# Patient Record
Sex: Female | Born: 1960
Health system: Southern US, Community
[De-identification: ages and names within clinical notes are randomized; demographics above are authoritative.]

## PROBLEM LIST (undated history)

## (undated) DIAGNOSIS — M1990 Unspecified osteoarthritis, unspecified site: Secondary | ICD-10-CM

## (undated) DIAGNOSIS — I10 Essential (primary) hypertension: Secondary | ICD-10-CM

## (undated) HISTORY — PX: UTERINE FIBROID EMBOLIZATION: SHX825

---

## 2012-11-02 ENCOUNTER — Telehealth: Payer: Self-pay | Admitting: Nurse Practitioner

## 2012-11-02 NOTE — Telephone Encounter (Signed)
NOT SEEN GREATER 3 YEARS. EXPLAINED TO PT WILL TAKE NEW PTS STARTING June 1ST. Pt c/o small rash on back. No blisters. Will go to urgent care per pt.

## 2012-11-23 ENCOUNTER — Ambulatory Visit: Payer: Self-pay | Admitting: Family Medicine

## 2013-11-28 ENCOUNTER — Encounter: Payer: Self-pay | Admitting: Physician Assistant

## 2013-11-28 ENCOUNTER — Ambulatory Visit (INDEPENDENT_AMBULATORY_CARE_PROVIDER_SITE_OTHER): Payer: BC Managed Care – PPO | Admitting: Physician Assistant

## 2013-11-28 VITALS — BP 120/90 | HR 98 | Temp 98.0°F | Ht 62.5 in | Wt 180.0 lb

## 2013-11-28 DIAGNOSIS — M722 Plantar fascial fibromatosis: Secondary | ICD-10-CM

## 2013-11-28 NOTE — Progress Notes (Signed)
Subjective:     Patient ID: Yolanda Gordon, female   DOB: 1960/09/02, 53 y.o.   MRN: [id number]  HPI Pt with R foot pain for several weeks She denies any injury to the foot Sx worse first thing in the morning or if she has been sitting for a while Sx improve after being up on it No trauma to the foot  Review of Systems Denies any swelling or bruising to the foot No numbness to the foot No OTC tx    Objective:   Physical Exam No ecchy/edema to the foot Good pulses/sensory No TTP of the ankle Sx TTP along the plantar fasc FROM of the foot No TTP Achilles    Assessment:     Plantar Fascitis    Plan:     Heat/Ice Massage Stretching using a tennis ball Nl course reviewed OTC NSAIDS Good shoes with support F/U prn

## 2013-11-28 NOTE — Patient Instructions (Signed)
Plantar Fasciitis  Plantar fasciitis is a common condition that causes foot pain. It is soreness (inflammation) of the band of tough fibrous tissue on the bottom of the foot that runs from the heel bone (calcaneus) to the ball of the foot. The cause of this soreness may be from excessive standing, poor fitting shoes, running on hard surfaces, being overweight, having an abnormal walk, or overuse (this is common in runners) of the painful foot or feet. It is also common in aerobic exercise dancers and ballet dancers.  SYMPTOMS   Most people with plantar fasciitis complain of:   Severe pain in the morning on the bottom of their foot especially when taking the first steps out of bed. This pain recedes after a few minutes of walking.   Severe pain is experienced also during walking following a long period of inactivity.   Pain is worse when walking barefoot or up stairs  DIAGNOSIS    Your caregiver will diagnose this condition by examining and feeling your foot.   Special tests such as X-rays of your foot, are usually not needed.  PREVENTION    Consult a sports medicine professional before beginning a new exercise program.   Walking programs offer a good workout. With walking there is a lower chance of overuse injuries common to runners. There is less impact and less jarring of the joints.   Begin all new exercise programs slowly. If problems or pain develop, decrease the amount of time or distance until you are at a comfortable level.   Wear good shoes and replace them regularly.   Stretch your foot and the heel cords at the back of the ankle (Achilles tendon) both before and after exercise.   Run or exercise on even surfaces that are not hard. For example, asphalt is better than pavement.   Do not run barefoot on hard surfaces.   If using a treadmill, vary the incline.   Do not continue to workout if you have foot or joint problems. Seek professional help if they do not improve.  HOME CARE INSTRUCTIONS     Avoid activities that cause you pain until you recover.   Use ice or cold packs on the problem or painful areas after working out.   Only take over-the-counter or prescription medicines for pain, discomfort, or fever as directed by your caregiver.   Soft shoe inserts or athletic shoes with air or gel sole cushions may be helpful.   If problems continue or become more severe, consult a sports medicine caregiver or your own health care provider. Cortisone is a potent anti-inflammatory medication that may be injected into the painful area. You can discuss this treatment with your caregiver.  MAKE SURE YOU:    Understand these instructions.   Will watch your condition.   Will get help right away if you are not doing well or get worse.  Document Released: 02/23/2001 Document Revised: 08/23/2011 Document Reviewed: 04/24/2008  ExitCare Patient Information 2015 [employer]. This information is not intended to replace advice given to you by your health care provider. Make sure you discuss any questions you have with your health care provider.

## 2013-12-26 ENCOUNTER — Telehealth: Payer: Self-pay | Admitting: Physician Assistant

## 2013-12-26 NOTE — Telephone Encounter (Signed)
appt scheduled for 3:45 on friday

## 2013-12-28 ENCOUNTER — Ambulatory Visit (INDEPENDENT_AMBULATORY_CARE_PROVIDER_SITE_OTHER): Payer: BC Managed Care – PPO

## 2013-12-28 ENCOUNTER — Encounter: Payer: Self-pay | Admitting: Nurse Practitioner

## 2013-12-28 ENCOUNTER — Ambulatory Visit (INDEPENDENT_AMBULATORY_CARE_PROVIDER_SITE_OTHER): Payer: BC Managed Care – PPO | Admitting: Nurse Practitioner

## 2013-12-28 VITALS — BP 140/82 | HR 78 | Temp 98.3°F | Ht 62.5 in | Wt 181.0 lb

## 2013-12-28 DIAGNOSIS — M7730 Calcaneal spur, unspecified foot: Secondary | ICD-10-CM

## 2013-12-28 DIAGNOSIS — M79609 Pain in unspecified limb: Secondary | ICD-10-CM

## 2013-12-28 DIAGNOSIS — M79671 Pain in right foot: Secondary | ICD-10-CM

## 2013-12-28 DIAGNOSIS — M7731 Calcaneal spur, right foot: Secondary | ICD-10-CM

## 2013-12-28 MED ORDER — PREDNISONE (PAK) 10 MG PO TABS: ORAL_TABLET | Freq: Every day | ORAL | Status: DC

## 2013-12-28 NOTE — Patient Instructions (Signed)
Heel Spur A heel spur is a hook of bone that can form on the calcaneus (the heel bone and the largest bone of the foot). Heel spurs are often associated with plantar fasciitis and usually come in people who have had the problem for an extended period of time. The cause of the relationship is unknown. The pain associated with them is thought to be caused by an inflammation (soreness and redness) of the plantar fascia rather than the spur itself. The plantar fascia is a thick fibrous like tissue that runs from the calcaneus (heel bone) to the ball of the foot. This strong, tight tissue helps maintain the arch of your foot. It helps distribute the weight across your foot as you walk or run. Stresses placed on the plantar fascia can be tremendous. When it is inflamed normal activities become painful. Pain is worse in the morning after sleeping. After sleeping the plantar fascia is tight. The first movements stretch the fascia and this causes pain. As the tendon loosens, the pain usually gets better. It often returns with too much standing or walking.  About 70% of patients with plantar fasciitis have a heel spur. About half of people without foot pain also have heel spurs. DIAGNOSIS  The diagnosis of a heel spur is made by X-ray. The X-ray shows a hook of bone protruding from the bottom of the calcaneus at the point where the plantar fascia is attached to the heel bone.  TREATMENT  It is necessary to find out what is causing the stretching of the plantar fascia. If the cause is over-pronation (flat feet), orthotics and proper foot ware may help.  Stretching exercises, losing weight, wearing shoes that have a cushioned heel that absorbs shock, and elevating the heel with the use of a heel cradle, heel cup, or orthotics may all help. Heel cradles and heel cups provide extra comfort and cushion to the heel, and reduce the amount of shock to the sore area. AVOIDING THE PAIN OF PLANTAR FASCIITIS AND HEEL  SPURS  Consult a sports medicine professional before beginning a new exercise program.  Walking programs offer a good workout. There is a lower chance of overuse injuries common to the runners. There is less impact and less jarring of the joints.  Begin all new exercise programs slowly. If problems or pains develop, decrease the amount of time or distance until you are at a comfortable level.  Wear good shoes and replace them regularly.  Stretch your foot and the heel cords at the back of the ankle (Achilles tendons) both before and after exercise.  Run or exercise on even surfaces that are not hard. For example, asphalt is better than pavement.  Do not run barefoot on hard surfaces.  If using a treadmill, vary the incline.  Do not continue to workout if you have foot or joint problems. Seek professional help if they do not improve. HOME CARE INSTRUCTIONS   Avoid activities that cause you pain until you recover.  Use ice or cold packs to the problem or painful areas after working out.  Only take over-the-counter or prescription medicines for pain, discomfort, or fever as directed by your caregiver.  Soft shoe inserts or athletic shoes with air or gel sole cushions may be helpful.  If problems continue or become more severe, consult a sports medicine caregiver. Cortisone is a potent anti-inflammatory medication that may be injected into the painful area. You can discuss this treatment with your caregiver. MAKE SURE YOU:     Understand these instructions.  Will watch your condition.  Will get help right away if you are not doing well or get worse. Document Released: 07/07/2005 Document Revised: 08/23/2011 Document Reviewed: 09/08/2005 ExitCare Patient Information 2015 [employer]. This information is not intended to replace advice given to you by your health care provider. Make sure you discuss any questions you have with your health care provider.  

## 2013-12-28 NOTE — Progress Notes (Signed)
   Subjective:    Patient ID: Yolanda Gordon, female    DOB: 10-26-1960, 53 y.o.   MRN: [id number]  HPI Patient in c/o right heel ain- saw B. Webster and he dx her with plantar fasciitis- she says that it is still hurting-    Review of Systems  Constitutional: Negative.   HENT: Negative.   Respiratory: Negative.   Cardiovascular: Negative.   Genitourinary: Negative.   Musculoskeletal: Positive for myalgias.  Neurological: Negative.   Psychiatric/Behavioral: Negative.   All other systems reviewed and are negative.      Objective:   Physical Exam  Constitutional: She is oriented to person, place, and time. She appears well-developed and well-nourished.  Cardiovascular: Normal rate, regular rhythm and normal heart sounds.   Pulmonary/Chest: Effort normal and breath sounds normal.  Musculoskeletal:  Right heel pain on palpation No edema  Neurological: She is alert and oriented to person, place, and time.  Skin: Skin is warm and dry.  Psychiatric: She has a normal mood and affect. Her behavior is normal. Judgment and thought content normal.   BP 140/82  Pulse 78  Temp(Src) 98.3 F (36.8 C) (Oral)  Ht 5' 2.5" (1.588 m)  Wt 181 lb (82.101 kg)  BMI 32.56 kg/m2  Right foot xray - small heel spur-Preliminary reading by Paulene FloorMary Thelma Lorenzetti, FNP  [facility]        Assessment & Plan:   1. Heel pain, right   2. Heel spur, right    Meds ordered this encounter  Medications  . predniSONE (STERAPRED UNI-PAK) 10 MG tablet    Sig: Take by mouth daily. As directed X6 day    Dispense:  21 tablet    Refill:  0    Order Specific Question:  Supervising Provider    Answer:  Ernestina PennaMOORE, DONALD W [[id number]]   roll foot on frozen coke bottle for 15 minutes 2x a day Rest RTO prn  Mary-Margaret Daphine DeutscherMartin, FNP

## 2014-11-01 ENCOUNTER — Ambulatory Visit: Payer: Self-pay | Admitting: Family Medicine

## 2014-11-14 ENCOUNTER — Ambulatory Visit: Payer: Self-pay | Admitting: Family Medicine

## 2014-11-14 ENCOUNTER — Encounter: Payer: Self-pay | Admitting: Nurse Practitioner

## 2015-05-06 ENCOUNTER — Ambulatory Visit: Payer: Self-pay

## 2015-07-04 ENCOUNTER — Ambulatory Visit: Payer: Self-pay | Admitting: Pediatrics

## 2015-07-07 ENCOUNTER — Encounter: Payer: Self-pay | Admitting: Nurse Practitioner

## 2015-10-29 ENCOUNTER — Ambulatory Visit (INDEPENDENT_AMBULATORY_CARE_PROVIDER_SITE_OTHER): Payer: BLUE CROSS/BLUE SHIELD | Admitting: Physician Assistant

## 2015-10-29 ENCOUNTER — Encounter: Payer: Self-pay | Admitting: Physician Assistant

## 2015-10-29 ENCOUNTER — Encounter (INDEPENDENT_AMBULATORY_CARE_PROVIDER_SITE_OTHER): Payer: Self-pay

## 2015-10-29 VITALS — BP 127/72 | HR 69 | Temp 97.7°F | Ht 62.5 in | Wt 133.4 lb

## 2015-10-29 DIAGNOSIS — M79601 Pain in right arm: Secondary | ICD-10-CM | POA: Diagnosis not present

## 2015-10-29 NOTE — Progress Notes (Signed)
Subjective:     Patient ID: Yolanda Gordon, female   DOB: Oct 19, 1960, 55 y.o.   MRN: [id number]  HPI Pt with intermit R arm pain She denies any trauma but does a  Lot of lifting at home and work Occasional numbness to the R hand No meds or bracing for sx Pt R hand dominant   Review of Systems + pain to the R elbow/forearm area + intermit numbness to the R hand    Objective:   Physical Exam NAD FROM of the shoulder w/o sx Good strength FROM of the R elbow w/o sx No TTP to the elbow or medial/lateral epicondyle area No atrophy noted Good pulses/sensory Grip strength equal bilat    Assessment:     1. Right arm pain        Plan:     Discussed this may be some early carpal tunnel symptoms or over use type syndrome Want her to minimize lifting at home and wear brace at work OTC NSAIDS for sx F/U if sx continue

## 2015-10-29 NOTE — Patient Instructions (Signed)

## 2015-10-30 ENCOUNTER — Ambulatory Visit: Payer: Self-pay | Admitting: Family

## 2015-12-11 ENCOUNTER — Ambulatory Visit (INDEPENDENT_AMBULATORY_CARE_PROVIDER_SITE_OTHER): Payer: BLUE CROSS/BLUE SHIELD | Admitting: Family

## 2015-12-11 ENCOUNTER — Encounter: Payer: Self-pay | Admitting: Family

## 2015-12-11 VITALS — BP 126/75 | HR 74 | Temp 97.1°F | Ht 62.5 in | Wt 128.9 lb

## 2015-12-11 DIAGNOSIS — M7711 Lateral epicondylitis, right elbow: Secondary | ICD-10-CM

## 2015-12-11 MED ORDER — PREDNISONE 10 MG (21) PO TBPK: 10.0000 mg | ORAL_TABLET | Freq: Every day | ORAL | Status: DC

## 2015-12-11 MED ORDER — NAPROXEN 500 MG PO TABS: 500.0000 mg | ORAL_TABLET | Freq: Two times a day (BID) | ORAL | Status: DC

## 2015-12-11 NOTE — Patient Instructions (Signed)
Tennis Elbow Tennis elbow (lateral epicondylitis) is inflammation of the outer tendons of your forearm close to your elbow. Your tendons attach your muscles to your bones. The outer tendons of your forearm are used to extend your wrist, and they attach on the outside part of your elbow. Tennis elbow is often found in people who play tennis, but anyone may get the condition from repeatedly extending the wrist or turning the forearm. CAUSES This condition is caused by repeatedly extending your wrist and using your hands. It can result from sports or work that requires repetitive forearm movements. Tennis elbow may also be caused by an injury. RISK FACTORS You have a higher risk of developing tennis elbow if you play tennis or another racquet sport. You also have a higher risk if you frequently use your hands for work. This condition is also more likely to develop in:  Musicians.  Carpenters, painters, and plumbers.  Cooks.  Cashiers.  People who work in factories.  Construction workers.  Butchers.  People who use computers. SYMPTOMS Symptoms of this condition include:  Pain and tenderness in your forearm and the outer part of your elbow. You may only feel the pain when you use your arm, or you may feel it even when you are not using your arm.  A burning feeling that runs from your elbow through your arm.  Weak grip in your hands. DIAGNOSIS  This condition may be diagnosed by medical history and physical exam. You may also have other tests, including:  X-rays.  MRI. TREATMENT Your health care provider will recommend lifestyle adjustments, such as resting and icing your arm. Treatment may also include:  Medicines for inflammation. This may include shots of cortisone if your pain continues.  Physical therapy. This may include massage or exercises.  An elbow brace. Surgery may eventually be recommended if your pain does not go away with treatment. HOME CARE  INSTRUCTIONS Activity  Rest your elbow and wrist as directed by your health care provider. Try to avoid any activities that caused the problem until your health care provider says that you can do them again.  If a physical therapist teaches you exercises, do all of them as directed.  If you lift an object, lift it with your palm facing upward. This lowers the stress on your elbow. Lifestyle  If your tennis elbow is caused by sports, check your equipment and make sure that:  You are using it correctly.  It is the best fit for you.  If your tennis elbow is caused by work, take breaks frequently, if you are able. Talk with your manager about how to best perform tasks in a way that is safe.  If your tennis elbow is caused by computer use, talk with your manager about any changes that can be made to your work environment. General Instructions  If directed, apply ice to the painful area:  Put ice in a plastic bag.  Place a towel between your skin and the bag.  Leave the ice on for 20 minutes, 2-3 times per day.  Take medicines only as directed by your health care provider.  If you were given a brace, wear it as directed by your health care provider.  Keep all follow-up visits as directed by your health care provider. This is important. SEEK MEDICAL CARE IF:  Your pain does not get better with treatment.  Your pain gets worse.  You have numbness or weakness in your forearm, hand, or fingers.     This information is not intended to replace advice given to you by your health care provider. Make sure you discuss any questions you have with your health care provider.   Document Released: 05/31/2005 Document Revised: 10/15/2014 Document Reviewed: 05/27/2014 Elsevier Interactive Patient Education 2016 [employer] Inc.  

## 2015-12-11 NOTE — Progress Notes (Signed)
   Subjective:    Patient ID: Yolanda Gordon, female    DOB: Dec 15, 1960, 55 y.o.   MRN: [id number]  Arm Pain  The incident occurred more than 1 week ago. There was no injury mechanism. The pain is present in the right elbow. The quality of the pain is described as aching. The pain does not radiate. The pain is at a severity of 6/10. The pain is moderate. The pain has been intermittent since the incident. Associated symptoms include numbness and tingling. Nothing aggravates the symptoms. She has tried acetaminophen for the symptoms. The treatment provided no relief.      Review of Systems  Constitutional: Negative.   HENT: Negative.   Eyes: Negative.   Respiratory: Negative.  Negative for shortness of breath.   Cardiovascular: Negative.  Negative for palpitations.  Gastrointestinal: Negative.   Endocrine: Negative.   Genitourinary: Negative.   Musculoskeletal: Positive for joint swelling.  Neurological: Positive for tingling and numbness. Negative for headaches.  Hematological: Negative.   Psychiatric/Behavioral: Negative.   All other systems reviewed and are negative.      Objective:   Physical Exam  Constitutional: She is oriented to person, place, and time. She appears well-developed and well-nourished. No distress.  HENT:  Head: Normocephalic.  Cardiovascular: Normal rate, regular rhythm, normal heart sounds and intact distal pulses.   No murmur heard. Pulmonary/Chest: Effort normal and breath sounds normal. No respiratory distress. She has no wheezes.  Abdominal: Soft. Bowel sounds are normal. She exhibits no distension. There is no tenderness.  Musculoskeletal: Normal range of motion. She exhibits tenderness (right lateral distal elbow tenderness with palpation  ). She exhibits no edema.  Neurological: She is alert and oriented to person, place, and time. She has normal reflexes. No cranial nerve deficit.  Skin: Skin is warm and dry.  Psychiatric: She has a normal mood and  affect. Her behavior is normal. Judgment and thought content normal.  Vitals reviewed.   BP 126/75 mmHg  Pulse 74  Temp(Src) 97.1 F (36.2 C) (Oral)  Ht 5' 2.5" (1.588 m)  Wt 128 lb 14.4 oz (58.469 kg)  BMI 23.19 kg/m2       Assessment & Plan:  1. Tennis elbow, right -Ice -Rest -Take naprosyn with food BID with food for next 7-10 days -RTO prn  - predniSONE (STERAPRED UNI-PAK 21 TAB) 10 MG (21) TBPK tablet; Take 1 tablet (10 mg total) by mouth daily. As directed x 6 days  Dispense: 21 tablet; Refill: 0 - naproxen (NAPROSYN) 500 MG tablet; Take 1 tablet (500 mg total) by mouth 2 (two) times daily with a meal.  Dispense: 60 tablet; Refill: 1  Jannifer Rodneyhristy Markale Birdsell, FNP

## 2015-12-26 ENCOUNTER — Telehealth: Payer: Self-pay | Admitting: Family

## 2015-12-26 DIAGNOSIS — G5601 Carpal tunnel syndrome, right upper limb: Secondary | ICD-10-CM

## 2015-12-26 MED ORDER — WRIST BRACE DELUXE/RIGHT S/M MISC: 1.0000 | Freq: Every day | Status: DC

## 2015-12-26 NOTE — Telephone Encounter (Signed)
Prescription sent to pharmacy.

## 2015-12-29 ENCOUNTER — Telehealth: Payer: Self-pay | Admitting: Nurse Practitioner

## 2015-12-29 NOTE — Telephone Encounter (Signed)
Scheduled

## 2016-01-12 ENCOUNTER — Telehealth: Payer: Self-pay | Admitting: Family

## 2016-01-12 DIAGNOSIS — G5601 Carpal tunnel syndrome, right upper limb: Secondary | ICD-10-CM

## 2016-01-13 MED ORDER — TRUFORM ARM SLEEVE M 15-20MMHG MISC: 0 refills | Status: DC

## 2016-01-13 MED ORDER — WRIST BRACE DELUXE/RIGHT S/M MISC: 1.0000 | Freq: Every day | 0 refills | Status: DC

## 2016-01-13 NOTE — Telephone Encounter (Signed)
RX sent

## 2016-01-29 ENCOUNTER — Encounter: Payer: Self-pay | Admitting: Nurse Practitioner

## 2016-01-29 ENCOUNTER — Ambulatory Visit (INDEPENDENT_AMBULATORY_CARE_PROVIDER_SITE_OTHER): Payer: BLUE CROSS/BLUE SHIELD | Admitting: Nurse Practitioner

## 2016-01-29 VITALS — BP 122/72 | HR 71 | Temp 97.2°F | Ht 62.0 in | Wt 132.0 lb

## 2016-01-29 DIAGNOSIS — M7711 Lateral epicondylitis, right elbow: Secondary | ICD-10-CM | POA: Diagnosis not present

## 2016-01-29 MED ORDER — TENNIS ELBOW STRAP/AIR PAD MISC: Status: DC

## 2016-01-29 MED ORDER — PREDNISONE 10 MG (21) PO TBPK: ORAL_TABLET | ORAL | 0 refills | Status: DC

## 2016-01-29 NOTE — Progress Notes (Signed)
   Subjective:    Patient ID: Baruch Goutyoni Klippel, female    DOB: 25-Feb-1961, 55 y.o.   MRN: [id number]  HPI  Patient in c/o right arm pain. She was seen on 12/11/15 and was diagnosed with right tennis elbow. She was given naprosyn and rx for tennis elbow strap which sh ehas not been able to get because rx never went to pharmacy. Pain is still in right lateral elbow. Worse when using it or picking up object.   Review of Systems  Constitutional: Negative.   HENT: Negative.   Respiratory: Negative.   Cardiovascular: Negative.   Genitourinary: Negative.   Neurological: Negative.   Psychiatric/Behavioral: Negative.   All other systems reviewed and are negative.      Objective:   Physical Exam  Constitutional: She is oriented to person, place, and time. She appears well-developed and well-nourished. No distress.  Cardiovascular: Normal rate, regular rhythm and normal heart sounds.   Pulmonary/Chest: Effort normal and breath sounds normal.  Musculoskeletal:  Pain on palpation right lateral epicondyle-can produce pain with gripping of right hand- no edema r erythema FROM of elbow without pain  Neurological: She is alert and oriented to person, place, and time.  Skin: Skin is warm.  Psychiatric: She has a normal mood and affect. Her behavior is normal. Judgment and thought content normal.    BP 122/72   Pulse 71   Temp 97.2 F (36.2 C) (Oral)   Ht 5\' 2"  (1.575 m)   Wt 132 lb (59.9 kg)   BMI 24.14 kg/m        Assessment & Plan:  1. Right tennis elbow Rest arm when can RTO prn - predniSONE (STERAPRED UNI-PAK 21 TAB) 10 MG (21) TBPK tablet; As directed x 6 days  Dispense: 21 tablet; Refill: 0 - Elastic Bandages & Supports (TENNIS ELBOW STRAP/AIR PAD) MISC; Wear all day for 4-5 weeks  Dispense: 1 each; Refill: o  Mary-Margaret Daphine DeutscherMartin, FNP

## 2016-01-29 NOTE — Patient Instructions (Signed)
Tennis Elbow Tennis elbow (lateral epicondylitis) is inflammation of the outer tendons of your forearm close to your elbow. Your tendons attach your muscles to your bones. The outer tendons of your forearm are used to extend your wrist, and they attach on the outside part of your elbow. Tennis elbow is often found in people who play tennis, but anyone may get the condition from repeatedly extending the wrist or turning the forearm. CAUSES This condition is caused by repeatedly extending your wrist and using your hands. It can result from sports or work that requires repetitive forearm movements. Tennis elbow may also be caused by an injury. RISK FACTORS You have a higher risk of developing tennis elbow if you play tennis or another racquet sport. You also have a higher risk if you frequently use your hands for work. This condition is also more likely to develop in:  Musicians.  Carpenters, painters, and plumbers.  Cooks.  Cashiers.  People who work in factories.  Construction workers.  Butchers.  People who use computers. SYMPTOMS Symptoms of this condition include:  Pain and tenderness in your forearm and the outer part of your elbow. You may only feel the pain when you use your arm, or you may feel it even when you are not using your arm.  A burning feeling that runs from your elbow through your arm.  Weak grip in your hands. DIAGNOSIS  This condition may be diagnosed by medical history and physical exam. You may also have other tests, including:  X-rays.  MRI. TREATMENT Your health care provider will recommend lifestyle adjustments, such as resting and icing your arm. Treatment may also include:  Medicines for inflammation. This may include shots of cortisone if your pain continues.  Physical therapy. This may include massage or exercises.  An elbow brace. Surgery may eventually be recommended if your pain does not go away with treatment. HOME CARE  INSTRUCTIONS Activity  Rest your elbow and wrist as directed by your health care provider. Try to avoid any activities that caused the problem until your health care provider says that you can do them again.  If a physical therapist teaches you exercises, do all of them as directed.  If you lift an object, lift it with your palm facing upward. This lowers the stress on your elbow. Lifestyle  If your tennis elbow is caused by sports, check your equipment and make sure that:  You are using it correctly.  It is the best fit for you.  If your tennis elbow is caused by work, take breaks frequently, if you are able. Talk with your manager about how to best perform tasks in a way that is safe.  If your tennis elbow is caused by computer use, talk with your manager about any changes that can be made to your work environment. General Instructions  If directed, apply ice to the painful area:  Put ice in a plastic bag.  Place a towel between your skin and the bag.  Leave the ice on for 20 minutes, 2-3 times per day.  Take medicines only as directed by your health care provider.  If you were given a brace, wear it as directed by your health care provider.  Keep all follow-up visits as directed by your health care provider. This is important. SEEK MEDICAL CARE IF:  Your pain does not get better with treatment.  Your pain gets worse.  You have numbness or weakness in your forearm, hand, or fingers.     This information is not intended to replace advice given to you by your health care provider. Make sure you discuss any questions you have with your health care provider.   Document Released: 05/31/2005 Document Revised: 10/15/2014 Document Reviewed: 05/27/2014 Elsevier Interactive Patient Education 2016 [employer] Inc.  

## 2016-03-08 ENCOUNTER — Encounter: Payer: BLUE CROSS/BLUE SHIELD | Admitting: *Deleted

## 2016-06-18 ENCOUNTER — Encounter: Payer: Self-pay | Admitting: Family Medicine

## 2016-06-18 ENCOUNTER — Ambulatory Visit (INDEPENDENT_AMBULATORY_CARE_PROVIDER_SITE_OTHER): Payer: BLUE CROSS/BLUE SHIELD | Admitting: Family Medicine

## 2016-06-18 VITALS — BP 124/54 | HR 67 | Temp 97.0°F | Ht 62.0 in | Wt 132.5 lb

## 2016-06-18 DIAGNOSIS — M19032 Primary osteoarthritis, left wrist: Secondary | ICD-10-CM

## 2016-06-18 DIAGNOSIS — M7581 Other shoulder lesions, right shoulder: Secondary | ICD-10-CM | POA: Diagnosis not present

## 2016-06-18 DIAGNOSIS — M7711 Lateral epicondylitis, right elbow: Secondary | ICD-10-CM | POA: Diagnosis not present

## 2016-06-18 DIAGNOSIS — M778 Other enthesopathies, not elsewhere classified: Secondary | ICD-10-CM

## 2016-06-18 NOTE — Progress Notes (Signed)
BP (!) 124/54   Pulse 67   Temp 97 F (36.1 C) (Oral)   Ht 5\' 2"  (1.575 m)   Wt 132 lb 8 oz (60.1 kg)   BMI 24.23 kg/m    Subjective:    Patient ID: Yolanda Gordon, female    DOB: [birth year], 56 y.o.   MRN: [id number]  HPI: Yolanda Gordon is a 56 y.o. 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 She cannot recall any specific incident that brought this on like a fall or trauma. She has noticed that being off work for a couple days drastically improves it as she has been off work today the pain is very minimal. She denies any numbness or weakness in either hand or arm. She denies any pain in her neck or any radiating pain from that area.  Relevant past medical, surgical, family and social history reviewed and updated as indicated. Interim medical history since our last visit reviewed. Allergies and medications reviewed and updated.  Review of Systems  Constitutional: Negative for chills and fever.  HENT: Negative for congestion, ear discharge and ear pain.   Eyes: Negative for redness and visual disturbance.  Respiratory: Negative for chest tightness and shortness of breath.   Cardiovascular: Negative for chest pain and leg swelling.  Genitourinary: Negative for difficulty urinating and dysuria.  Musculoskeletal: Positive for arthralgias and myalgias. Negative for back pain, gait problem, neck pain and neck stiffness.  Skin: Negative for rash.  Neurological:  Negative for light-headedness and headaches.  Psychiatric/Behavioral: Negative for agitation and behavioral problems.  All other systems reviewed and are negative.   Per HPI unless specifically indicated above   Allergies as of 06/18/2016   No Known Allergies     Medication List       Accurate as of 06/18/16 12:04 PM. Always use your most recent med list.          naproxen 500 MG tablet Commonly known as:  NAPROSYN Take 1 tablet (500 mg total) by mouth 2 (two) times daily with a meal.   Tennis Elbow Strap/Air Pad Misc Wear all day for 4-5 weeks          Objective:    BP (!) 124/54   Pulse 67   Temp 97 F (36.1 C) (Oral)   Ht 5\' 2"  (1.575 m)   Wt 132 lb 8 oz (60.1 kg)   BMI 24.23 kg/m   Wt Readings from Last 3 Encounters:  06/18/16 132 lb 8 oz (60.1 kg)  01/29/16 132 lb (59.9 kg)  12/11/15 128 lb 14.4 oz (58.5 kg)    Physical Exam  Constitutional: She is oriented to person, place, and time. She appears well-developed and well-nourished. No distress.  Eyes: Conjunctivae are normal.  Cardiovascular: Normal rate, regular rhythm, normal heart sounds and intact distal pulses.   No murmur heard. Pulmonary/Chest: Effort normal and breath sounds normal. No respiratory distress. She  has no wheezes. She has no rales.  Musculoskeletal: Normal range of motion. She exhibits no edema.       Right shoulder: She exhibits normal range of motion (Rotator cuff testing is completely normal) and no tenderness (Unable to elicit tenderness and shoulder on exam but she says normally he gets on the posterior aspect of her shoulder.).       Right elbow: Tenderness found. Lateral epicondyle (Slight tenderness on lateral epicondyle, range of motion intact) tenderness noted.       Left wrist: She exhibits normal range of motion, no tenderness (Unable to elicit pain on palpation or with range of motion and left wrist today), no swelling, no crepitus and no deformity.  Neurological: She is alert  and oriented to person, place, and time. Coordination normal.  Skin: Skin is warm and dry. No rash noted. She is not diaphoretic.  Psychiatric: She has a normal mood and affect. Her behavior is normal.  Nursing note and vitals reviewed.   No results found for this or any previous visit.    Assessment & Plan:   Problem List Items Addressed This Visit    None    Visit Diagnoses    Arthritis of left wrist    -  Primary   Right lateral epicondylitis       Right shoulder tendinitis       Likely on supraspinatus, recommended naproxen and icy hot and trying to change mechanism of action at work and heat. Call back for steroid if worsens       Follow up plan: Return if symptoms worsen or fail to improve.  Counseling provided for all of the vaccine components No orders of the defined types were placed in this encounter.   Arville Care, MD [facility] 06/18/2016, 12:04 PM

## 2016-07-07 ENCOUNTER — Telehealth: Payer: Self-pay | Admitting: Nurse Practitioner

## 2016-08-04 ENCOUNTER — Ambulatory Visit: Payer: BLUE CROSS/BLUE SHIELD | Admitting: Family Medicine

## 2016-08-05 ENCOUNTER — Encounter: Payer: Self-pay | Admitting: Nurse Practitioner

## 2017-03-02 ENCOUNTER — Telehealth: Payer: Self-pay | Admitting: Nurse Practitioner

## 2017-03-02 NOTE — Telephone Encounter (Signed)
Left message to call back and make mammo appt.

## 2017-10-26 ENCOUNTER — Ambulatory Visit: Payer: BLUE CROSS/BLUE SHIELD | Admitting: Family Medicine

## 2017-10-28 ENCOUNTER — Encounter: Payer: Self-pay | Admitting: Nurse Practitioner

## 2017-11-25 ENCOUNTER — Ambulatory Visit: Payer: BLUE CROSS/BLUE SHIELD | Admitting: Nurse Practitioner

## 2017-11-30 ENCOUNTER — Encounter: Payer: Self-pay | Admitting: Nurse Practitioner

## 2018-10-26 ENCOUNTER — Telehealth: Payer: Self-pay | Admitting: Nurse Practitioner

## 2020-04-18 ENCOUNTER — Ambulatory Visit (INDEPENDENT_AMBULATORY_CARE_PROVIDER_SITE_OTHER): Payer: 59 | Admitting: Family Medicine

## 2020-04-18 ENCOUNTER — Encounter: Payer: Self-pay | Admitting: Family Medicine

## 2020-04-18 DIAGNOSIS — M25562 Pain in left knee: Secondary | ICD-10-CM

## 2020-04-18 NOTE — Progress Notes (Signed)
Virtual Visit via telephone Note  I connected with Yolanda Gordon on 04/18/20 at 1410 by telephone and verified that I am speaking with the correct person using two identifiers. Yolanda Gordon is currently located at home and patient are currently with her during visit. The provider, Elige Radon Kaleab Frasier, MD is located in their office at time of visit.  Call ended at 1416  I discussed the limitations, risks, security and privacy concerns of performing an evaluation and management service by telephone and the availability of in person appointments. I also discussed with the patient that there may be a patient responsible charge related to this service. The patient expressed understanding and agreed to proceed.   History and Present Illness: Patient is calling in for knee pain.  She has pain in her left knee and it hurts when she walks too much. She says it hurts over the knee cap when she walks. She has some popping in the knee.  She fell forward 1 week ago and landed on that knee.  She has some pain with bending. She is using tylenol and it is helping some.  No diagnosis found.  Outpatient Encounter Medications as of 04/18/2020  Medication Sig  . Elastic Bandages & Supports (TENNIS ELBOW STRAP/AIR PAD) MISC Wear all day for 4-5 weeks  . naproxen (NAPROSYN) 500 MG tablet Take 1 tablet (500 mg total) by mouth 2 (two) times daily with a meal.   No facility-administered encounter medications on file as of 04/18/2020.    Review of Systems  Constitutional: Negative for chills and fever.  Eyes: Negative for visual disturbance.  Respiratory: Negative for chest tightness and shortness of breath.   Cardiovascular: Negative for chest pain and leg swelling.  Musculoskeletal: Positive for arthralgias and joint swelling. Negative for back pain and gait problem.  Skin: Negative for color change and rash.  Neurological: Negative for light-headedness and headaches.  Psychiatric/Behavioral: Negative for  agitation and behavioral problems.  All other systems reviewed and are negative.   Observations/Objective: Patient sounds comfortable and in no acute distress  Assessment and Plan: Problem List Items Addressed This Visit    None    Visit Diagnoses    Acute pain of left knee    -  Primary   Relevant Orders   DG Knee 1-2 Views Left      Patient will come in on Monday and get an x-ray of that knee.  She says she could not make it up today.  If anything worsens she will go to the emergency department. Follow up plan: Return if symptoms worsen or fail to improve.     I discussed the assessment and treatment plan with the patient. The patient was provided an opportunity to ask questions and all were answered. The patient agreed with the plan and demonstrated an understanding of the instructions.   The patient was advised to call back or seek an in-person evaluation if the symptoms worsen or if the condition fails to improve as anticipated.  The above assessment and management plan was discussed with the patient. The patient verbalized understanding of and has agreed to the management plan. Patient is aware to call the clinic if symptoms persist or worsen. Patient is aware when to return to the clinic for a follow-up visit. Patient educated on when it is appropriate to go to the emergency department.    I provided 6 minutes of non-face-to-face time during this encounter.    Nils Pyle, MD

## 2020-04-21 ENCOUNTER — Other Ambulatory Visit: Payer: 59

## 2020-04-21 ENCOUNTER — Other Ambulatory Visit: Payer: Self-pay

## 2020-04-21 ENCOUNTER — Ambulatory Visit (INDEPENDENT_AMBULATORY_CARE_PROVIDER_SITE_OTHER): Payer: 59

## 2020-04-21 DIAGNOSIS — M25562 Pain in left knee: Secondary | ICD-10-CM

## 2020-09-19 ENCOUNTER — Encounter: Payer: Self-pay | Admitting: Nurse Practitioner

## 2020-09-19 ENCOUNTER — Ambulatory Visit (INDEPENDENT_AMBULATORY_CARE_PROVIDER_SITE_OTHER): Payer: 59 | Admitting: Nurse Practitioner

## 2020-09-19 ENCOUNTER — Ambulatory Visit (INDEPENDENT_AMBULATORY_CARE_PROVIDER_SITE_OTHER): Payer: 59

## 2020-09-19 ENCOUNTER — Other Ambulatory Visit: Payer: Self-pay

## 2020-09-19 VITALS — BP 148/85 | HR 84 | Temp 96.9°F | Resp 20 | Ht 62.0 in | Wt 165.0 lb

## 2020-09-19 DIAGNOSIS — Z01818 Encounter for other preprocedural examination: Secondary | ICD-10-CM

## 2020-09-19 NOTE — Progress Notes (Signed)
   Subjective:    Patient ID: Yolanda Gordon, female    DOB: [birth year], 60 y.o.   MRN: [id number]   Chief Complaint: Surgical clearance   HPI  Pt presents today for a surgical clearance. She is planning a total knee right knee arthroplasty with Dr Jene Every. Surgery date TBD after surgical clearance. Denies CP, SOB, heart palps, wheezing or cough.     Review of Systems  Constitutional: Negative for chills, fatigue and fever.  Respiratory: Negative for cough, shortness of breath and wheezing.   Cardiovascular: Negative for chest pain, palpitations and leg swelling.  Gastrointestinal: Negative for abdominal pain, blood in stool, constipation and diarrhea.  Genitourinary: Negative for difficulty urinating, dysuria and hematuria.  Musculoskeletal: Negative for back pain, myalgias and neck pain.  Neurological: Negative for dizziness, syncope, weakness, light-headedness, numbness and headaches.  Psychiatric/Behavioral: Negative for confusion and sleep disturbance. The patient is not nervous/anxious.    Vitals:   09/19/20 1150  BP: (!) 148/85  Pulse: 84  Resp: 20  Temp: (!) 96.9 F (36.1 C)  SpO2: 100%       Objective:   Physical Exam Constitutional:      Appearance: Normal appearance.  Neck:     Vascular: No carotid bruit.  Cardiovascular:     Rate and Rhythm: Normal rate and regular rhythm.     Pulses: Normal pulses.     Heart sounds: Normal heart sounds.  Pulmonary:     Effort: Pulmonary effort is normal.     Breath sounds: Normal breath sounds.  Abdominal:     General: Abdomen is flat. Bowel sounds are normal.     Palpations: Abdomen is soft.  Musculoskeletal:        General: Normal range of motion.     Cervical back: Normal range of motion and neck supple.  Skin:    General: Skin is warm and dry.     Capillary Refill: Capillary refill takes less than 2 seconds.  Neurological:     Mental Status: She is alert and oriented to person, place, and time.   Psychiatric:        Mood and Affect: Mood normal.        Behavior: Behavior normal.       Assessment & Plan:   Ruthell Feigenbaum comes in today with chief complaint of Surgical clearance   Diagnosis and orders addressed:  1. Pre-operative clearance Cleared for surgery.   - DG Chest 2 View - EKG 12-Lead   Follow up plan: PRN  Oretha Milch, RN, BSN, FNP-Student  Mary-Margaret Daphine Deutscher, FNP

## 2020-09-25 ENCOUNTER — Ambulatory Visit: Payer: Self-pay | Admitting: Orthopedic Surgery

## 2020-09-29 ENCOUNTER — Other Ambulatory Visit (HOSPITAL_COMMUNITY): Payer: Self-pay

## 2020-09-30 ENCOUNTER — Other Ambulatory Visit (HOSPITAL_COMMUNITY): Payer: Self-pay

## 2020-10-07 NOTE — Patient Instructions (Addendum)
DUE TO COVID-19 ONLY ONE VISITOR IS ALLOWED TO COME WITH YOU AND STAY IN THE WAITING ROOM ONLY DURING PRE OP AND PROCEDURE DAY OF SURGERY. THE 1 VISITOR  MAY VISIT WITH YOU AFTER SURGERY IN YOUR PRIVATE ROOM DURING VISITING HOURS ONLY!  YOU NEED TO HAVE A COVID 19 TEST ON: 10/14/20 @ 10:20 AM , THIS TEST MUST BE DONE BEFORE SURGERY,  COVID TESTING SITE [address] [place] [address], IT IS ON THE RIGHT GOING OUT [address] APPROXIMATELY  2 MINUTES PAST [employer] ON THE RIGHT. ONCE YOUR COVID TEST IS COMPLETED,  PLEASE BEGIN THE QUARANTINE INSTRUCTIONS AS OUTLINED IN YOUR HANDOUT.                Yolanda Gordon   Your procedure is scheduled on: 10/17/20   Report to [facility] Main  Entrance   Report to admitting at: 10:00 AM     Call this number if you have problems the morning of surgery [phone]    Remember:  NO SOLID FOOD AFTER MIDNIGHT THE NIGHT PRIOR TO SURGERY. NOTHING BY MOUTH EXCEPT CLEAR LIQUIDS UNTIL: 9:30 AM . PLEASE FINISH ENSURE DRINK PER SURGEON ORDER  WHICH NEEDS TO BE COMPLETED AT: 9:30 AM .  CLEAR LIQUID DIET  Foods Allowed                                                                     Foods Excluded  Coffee and tea, regular and decaf                             liquids that you cannot  Plain Jell-O any favor except red or purple                                           see through such as: Fruit ices (not with fruit pulp)                                     milk, soups, orange juice  Iced Popsicles                                    All solid food Carbonated beverages, regular and diet                                    Cranberry, grape and apple juices Sports drinks like Gatorade Lightly seasoned clear broth or consume(fat free) Sugar, honey syrup  Sample Menu Breakfast                                Lunch  Supper Cranberry juice                    Beef broth                             Chicken broth Jell-O                                     Grape juice                           Apple juice Coffee or tea                        Jell-O                                      Popsicle                                                Coffee or tea                        Coffee or tea  _____________________________________________________________________   BRUSH YOUR TEETH MORNING OF SURGERY AND RINSE YOUR MOUTH OUT, NO CHEWING GUM CANDY OR MINTS.                               You may not have any metal on your body including hair pins and              piercings  Do not wear jewelry, make-up, lotions, powders or perfumes, deodorant             Do not wear nail polish on your fingernails.  Do not shave  48 hours prior to surgery.    Do not bring valuables to the hospital. [facility] IS NOT             RESPONSIBLE   FOR VALUABLES.  Contacts, dentures or bridgework may not be worn into surgery.  Leave suitcase in the car. After surgery it may be brought to your room.     Patients discharged the day of surgery will not be allowed to drive home. IF YOU ARE HAVING SURGERY AND GOING HOME THE SAME DAY, YOU MUST HAVE AN ADULT TO DRIVE YOU HOME AND BE WITH YOU FOR 24 HOURS. YOU MAY GO HOME BY TAXI OR UBER OR ORTHERWISE, BUT AN ADULT MUST ACCOMPANY YOU HOME AND STAY WITH YOU FOR 24 HOURS.  Name and phone number of your driver:  Special Instructions: N/A              Please read over the following fact sheets you were given: _____________________________________________________________________          [facility] - Preparing for Surgery Before surgery, you can play an important role.  Because skin is not sterile, your skin needs to be as free of germs as possible.  You can reduce the number of germs on your skin by washing with CHG (chlorahexidine gluconate) soap  before surgery.  CHG is an antiseptic cleaner which kills germs and bonds with the skin to continue killing germs even after  washing. Please DO NOT use if you have an allergy to CHG or antibacterial soaps.  If your skin becomes reddened/irritated stop using the CHG and inform your nurse when you arrive at Short Stay. Do not shave (including legs and underarms) for at least 48 hours prior to the first CHG shower.  You may shave your face/neck. Please follow these instructions carefully:  1.  Shower with CHG Soap the night before surgery and the  morning of Surgery.  2.  If you choose to wash your hair, wash your hair first as usual with your  normal  shampoo.  3.  After you shampoo, rinse your hair and body thoroughly to remove the  shampoo.                           4.  Use CHG as you would any other liquid soap.  You can apply chg directly  to the skin and wash                       Gently with a scrungie or clean washcloth.  5.  Apply the CHG Soap to your body ONLY FROM THE NECK DOWN.   Do not use on face/ open                           Wound or open sores. Avoid contact with eyes, ears mouth and genitals (private parts).                       Wash face,  Genitals (private parts) with your normal soap.             6.  Wash thoroughly, paying special attention to the area where your surgery  will be performed.  7.  Thoroughly rinse your body with warm water from the neck down.  8.  DO NOT shower/wash with your normal soap after using and rinsing off  the CHG Soap.                9.  Pat yourself dry with a clean towel.            10.  Wear clean pajamas.            11.  Place clean sheets on your bed the night of your first shower and do not  sleep with pets. Day of Surgery : Do not apply any lotions/deodorants the morning of surgery.  Please wear clean clothes to the hospital/surgery center.  FAILURE TO FOLLOW THESE INSTRUCTIONS MAY RESULT IN THE CANCELLATION OF YOUR SURGERY PATIENT SIGNATURE_________________________________  NURSE  SIGNATURE__________________________________  ________________________________________________________________________   Yolanda Gordon  An incentive spirometer is a tool that can help keep your lungs clear and active. This tool measures how well you are filling your lungs with each breath. Taking long deep breaths may help reverse or decrease the chance of developing breathing (pulmonary) problems (especially infection) following:  A long period of time when you are unable to move or be active. BEFORE THE PROCEDURE   If the spirometer includes an indicator to show your best effort, your nurse or respiratory therapist will set it to a desired goal.  If possible, sit up straight or lean slightly forward. Try  not to slouch.  Hold the incentive spirometer in an upright position. INSTRUCTIONS FOR USE  1. Sit on the edge of your bed if possible, or sit up as far as you can in bed or on a chair. 2. Hold the incentive spirometer in an upright position. 3. Breathe out normally. 4. Place the mouthpiece in your mouth and seal your lips tightly around it. 5. Breathe in slowly and as deeply as possible, raising the piston or the ball toward the top of the column. 6. Hold your breath for 3-5 seconds or for as long as possible. Allow the piston or ball to fall to the bottom of the column. 7. Remove the mouthpiece from your mouth and breathe out normally. 8. Rest for a few seconds and repeat Steps 1 through 7 at least 10 times every 1-2 hours when you are awake. Take your time and take a few normal breaths between deep breaths. 9. The spirometer may include an indicator to show your best effort. Use the indicator as a goal to work toward during each repetition. 10. After each set of 10 deep breaths, practice coughing to be sure your lungs are clear. If you have an incision (the cut made at the time of surgery), support your incision when coughing by placing a pillow or rolled up towels firmly  against it. Once you are able to get out of bed, walk around indoors and cough well. You may stop using the incentive spirometer when instructed by your caregiver.  RISKS AND COMPLICATIONS  Take your time so you do not get dizzy or light-headed.  If you are in pain, you may need to take or ask for pain medication before doing incentive spirometry. It is harder to take a deep breath if you are having pain. AFTER USE  Rest and breathe slowly and easily.  It can be helpful to keep track of a log of your progress. Your caregiver can provide you with a simple table to help with this. If you are using the spirometer at home, follow these instructions: [place] IF:   You are having difficultly using the spirometer.  You have trouble using the spirometer as often as instructed.  Your pain medication is not giving enough relief while using the spirometer.  You develop fever of 100.5 F (38.1 C) or higher. SEEK IMMEDIATE MEDICAL CARE IF:   You cough up bloody sputum that had not been present before.  You develop fever of 102 F (38.9 C) or greater.  You develop worsening pain at or near the incision site. MAKE SURE YOU:   Understand these instructions.  Will watch your condition.  Will get help right away if you are not doing well or get worse. Document Released: 10/11/2006 Document Revised: 08/23/2011 Document Reviewed: 12/12/2006 [facility] Patient Information 2014 [place], [place].   ________________________________________________________________________

## 2020-10-08 ENCOUNTER — Other Ambulatory Visit: Payer: Self-pay

## 2020-10-08 ENCOUNTER — Encounter (HOSPITAL_COMMUNITY)
Admission: RE | Admit: 2020-10-08 | Discharge: 2020-10-08 | Disposition: A | Discharge status: Home / Self Care | Payer: 59 | Source: Ambulatory Visit | Attending: Specialist | Admitting: Specialist

## 2020-10-08 ENCOUNTER — Encounter (HOSPITAL_COMMUNITY): Payer: Self-pay

## 2020-10-08 DIAGNOSIS — Z01812 Encounter for preprocedural laboratory examination: Secondary | ICD-10-CM | POA: Insufficient documentation

## 2020-10-08 HISTORY — DX: Unspecified osteoarthritis, unspecified site: M19.90

## 2020-10-08 LAB — URINALYSIS, ROUTINE W REFLEX MICROSCOPIC
Bacteria, UA: NONE SEEN
Bilirubin Urine: NEGATIVE
Glucose, UA: NEGATIVE mg/dL
Ketones, ur: NEGATIVE mg/dL
Leukocytes,Ua: NEGATIVE
Nitrite: NEGATIVE
Protein, ur: NEGATIVE mg/dL
Specific Gravity, Urine: 1.006 (ref 1.005–1.030)
pH: 6 (ref 5.0–8.0)

## 2020-10-08 LAB — CBC
HCT: 39.5 % (ref 36.0–46.0)
Hemoglobin: 12.8 g/dL (ref 12.0–15.0)
MCH: 29.4 pg (ref 26.0–34.0)
MCHC: 32.4 g/dL (ref 30.0–36.0)
MCV: 90.6 fL (ref 80.0–100.0)
Platelets: 370 10*3/uL (ref 150–400)
RBC: 4.36 MIL/uL (ref 3.87–5.11)
RDW: 13 % (ref 11.5–15.5)
WBC: 5.6 10*3/uL (ref 4.0–10.5)
nRBC: 0 % (ref 0.0–0.2)

## 2020-10-08 LAB — SURGICAL PCR SCREEN
MRSA, PCR: NEGATIVE
Staphylococcus aureus: POSITIVE — AB

## 2020-10-08 LAB — BASIC METABOLIC PANEL
Anion gap: 9 (ref 5–15)
BUN: 10 mg/dL (ref 6–20)
CO2: 25 mmol/L (ref 22–32)
Calcium: 9.3 mg/dL (ref 8.9–10.3)
Chloride: 110 mmol/L (ref 98–111)
Creatinine, Ser: 0.63 mg/dL (ref 0.44–1.00)
GFR, Estimated: >60 mL/min (ref >60)
Glucose, Bld: 124 mg/dL — ABNORMAL HIGH (ref 70–99)
Potassium: 4.1 mmol/L (ref 3.5–5.1)
Sodium: 144 mmol/L (ref 135–145)

## 2020-10-08 LAB — APTT: aPTT: 29 seconds (ref 24–36)

## 2020-10-08 LAB — PROTIME-INR
INR: 0.9 (ref 0.8–1.2)
Prothrombin Time: 12.3 seconds (ref 11.4–15.2)

## 2020-10-08 NOTE — Progress Notes (Signed)
COVID Vaccine Completed: Yes Date COVID Vaccine completed: 06/2020 COVID vaccine manufacturer:    Moderna     PCP - Benjamin Stain: FNP Cardiologist -   Chest x-ray -  EKG -  Stress Test -  ECHO -  Cardiac Cath -  Pacemaker/ICD device last checked:  Sleep Study -  CPAP -   Fasting Blood Sugar -  Checks Blood Sugar _____ times a day  Blood Thinner Instructions: Aspirin Instructions: Last Dose:  Anesthesia review:   Patient denies shortness of breath, fever, cough and chest pain at PAT appointment   Patient verbalized understanding of instructions that were given to them at the PAT appointment. Patient was also instructed that they will need to review over the PAT instructions again at home before surgery.

## 2020-10-13 ENCOUNTER — Encounter (HOSPITAL_COMMUNITY): Payer: Self-pay

## 2020-10-14 ENCOUNTER — Other Ambulatory Visit (HOSPITAL_COMMUNITY): Payer: 59

## 2020-10-20 ENCOUNTER — Other Ambulatory Visit (HOSPITAL_COMMUNITY): Payer: Self-pay

## 2020-10-21 NOTE — Progress Notes (Signed)
Ms. Spittler made aware to arrive at 10:30AM 10/27/2020, drink pre surgery drink at 10:00AM, and COVID test 5/12 at 9:00AM. Reviewed pre op instructions, Ms. Dileonardo verbalized understanding.

## 2020-10-23 ENCOUNTER — Other Ambulatory Visit (HOSPITAL_COMMUNITY)
Admission: RE | Admit: 2020-10-23 | Discharge: 2020-10-23 | Disposition: A | Discharge status: Home / Self Care | Payer: 59 | Source: Ambulatory Visit | Attending: Specialist | Admitting: Specialist

## 2020-10-23 DIAGNOSIS — Z20822 Contact with and (suspected) exposure to covid-19: Secondary | ICD-10-CM | POA: Insufficient documentation

## 2020-10-23 DIAGNOSIS — Z01812 Encounter for preprocedural laboratory examination: Secondary | ICD-10-CM | POA: Insufficient documentation

## 2020-10-23 LAB — SARS CORONAVIRUS 2 (TAT 6-24 HRS): SARS Coronavirus 2: NEGATIVE

## 2020-10-24 ENCOUNTER — Encounter (HOSPITAL_COMMUNITY): Payer: Self-pay

## 2020-10-24 ENCOUNTER — Encounter (HOSPITAL_COMMUNITY): Payer: 59

## 2020-10-24 ENCOUNTER — Encounter (HOSPITAL_COMMUNITY): Payer: Self-pay | Admitting: Certified Registered"

## 2020-10-27 ENCOUNTER — Encounter (HOSPITAL_COMMUNITY): Admission: RE | Payer: Self-pay | Source: Other Acute Inpatient Hospital

## 2020-10-27 ENCOUNTER — Inpatient Hospital Stay (HOSPITAL_COMMUNITY): Admission: RE | Admit: 2020-10-27 | Payer: 59 | Source: Other Acute Inpatient Hospital | Admitting: Specialist

## 2020-10-27 SURGERY — ARTHROPLASTY, KNEE, TOTAL
Anesthesia: Spinal | Site: Knee | Laterality: Right

## 2020-10-31 ENCOUNTER — Other Ambulatory Visit: Payer: Self-pay

## 2020-10-31 ENCOUNTER — Encounter: Payer: Self-pay | Admitting: Family Medicine

## 2020-10-31 ENCOUNTER — Ambulatory Visit (INDEPENDENT_AMBULATORY_CARE_PROVIDER_SITE_OTHER): Payer: 59 | Admitting: Family Medicine

## 2020-10-31 VITALS — BP 148/72 | HR 65 | Temp 98.1°F | Ht 62.0 in | Wt 158.0 lb

## 2020-10-31 DIAGNOSIS — R030 Elevated blood-pressure reading, without diagnosis of hypertension: Secondary | ICD-10-CM

## 2020-10-31 DIAGNOSIS — M545 Low back pain, unspecified: Secondary | ICD-10-CM

## 2020-10-31 DIAGNOSIS — N3281 Overactive bladder: Secondary | ICD-10-CM

## 2020-10-31 LAB — URINALYSIS, ROUTINE W REFLEX MICROSCOPIC
Bilirubin, UA: NEGATIVE
Glucose, UA: NEGATIVE
Ketones, UA: NEGATIVE
Leukocytes,UA: NEGATIVE
Nitrite, UA: NEGATIVE
Protein,UA: NEGATIVE
Specific Gravity, UA: 1.01 (ref 1.005–1.030)
Urobilinogen, Ur: 0.2 mg/dL (ref 0.2–1.0)
pH, UA: 5.5 (ref 5.0–7.5)

## 2020-10-31 LAB — MICROSCOPIC EXAMINATION
Bacteria, UA: NONE SEEN
Epithelial Cells (non renal): NONE SEEN /hpf (ref 0–10)
WBC, UA: NONE SEEN /hpf (ref 0–5)

## 2020-10-31 MED ORDER — OXYBUTYNIN CHLORIDE ER 5 MG PO TB24: 5.0000 mg | ORAL_TABLET | Freq: Every day | ORAL | 0 refills | Status: DC

## 2020-10-31 NOTE — Progress Notes (Signed)
Acute Office Visit  Subjective:    Patient ID: Yolanda Gordon, female    DOB: 1960/09/28, 60 y.o.   MRN: [id number]  Chief Complaint  Patient presents with  . Urinary Frequency    HPI Patient is in today for urinary frequency for about 3 days. Also reports urgency for a few months. She has been having lower back pain for about 1 week. The back pain is lower and intermittent. It is mild- moderate. Denies saddle anesthesia, or numbness/tingling in legs or feet. Denies fever, chills, nausea, vomiting, abdominal pain, or dysuria. Denies incontinence, diarrhea, of constipation. She has been staying well hydrated. She does have to get up 3-4 times a night to void.   Past Medical History:  Diagnosis Date  . Arthritis     Past Surgical History:  Procedure Laterality Date  . UTERINE FIBROID EMBOLIZATION      History reviewed. No pertinent family history.    Outpatient Medications Prior to Visit  Medication Sig Dispense Refill  . acetaminophen (TYLENOL) 500 MG tablet Take 1,000 mg by mouth every 6 (six) hours as needed for moderate pain.     No facility-administered medications prior to visit.    No Known Allergies  Review of Systems As per HPI.     Objective:    Physical Exam Vitals and nursing note reviewed.  Constitutional:      General: She is not in acute distress.    Appearance: Normal appearance. She is not ill-appearing, toxic-appearing or diaphoretic.  Cardiovascular:     Rate and Rhythm: Normal rate and regular rhythm.  Pulmonary:     Effort: Pulmonary effort is normal.     Breath sounds: Normal breath sounds.  Abdominal:     General: Bowel sounds are normal. There is no distension.     Palpations: Abdomen is soft.     Tenderness: There is no abdominal tenderness. There is no right CVA tenderness, left CVA tenderness or guarding.  Musculoskeletal:     Right lower leg: No edema.     Left lower leg: No edema.  Skin:    General: Skin is warm and dry.   Neurological:     General: No focal deficit present.     Mental Status: She is alert and oriented to person, place, and time.  Psychiatric:        Mood and Affect: Mood normal.        Behavior: Behavior normal.     BP (!) 148/72   Pulse 65   Temp 98.1 F (36.7 C) (Temporal)   Ht 5\' 2"  (1.575 m)   Wt 158 lb (71.7 kg)   BMI 28.90 kg/m  Wt Readings from Last 3 Encounters:  10/31/20 158 lb (71.7 kg)  09/19/20 165 lb (74.8 kg)  06/18/16 132 lb 8 oz (60.1 kg)   Urine dipstick shows positive for RBC's.  Micro exam: 0 WBC's per HPF, 0-2 RBC's per HPF and no bacteria.   Health Maintenance Due  Topic Date Due  . HIV Screening  Never done  . Hepatitis C Screening  Never done  . TETANUS/TDAP  Never done  . PAP SMEAR-Modifier  Never done  . COLONOSCOPY (Pts 45-80yrs Insurance coverage will need to be confirmed)  Never done  . MAMMOGRAM  Never done    There are no preventive care reminders to display for this patient.   No results found for: TSH Lab Results  Component Value Date   WBC 5.6 10/08/2020   HGB  12.8 10/08/2020   HCT 39.5 10/08/2020   MCV 90.6 10/08/2020   PLT 370 10/08/2020   Lab Results  Component Value Date   NA 144 10/08/2020   K 4.1 10/08/2020   CO2 25 10/08/2020   GLUCOSE 124 (H) 10/08/2020   BUN 10 10/08/2020   CREATININE 0.63 10/08/2020   CALCIUM 9.3 10/08/2020   ANIONGAP 9 10/08/2020   No results found for: CHOL No results found for: HDL No results found for: LDLCALC No results found for: TRIG No results found for: CHOLHDL No results found for: OMVE7M     Assessment & Plan:   Idamae was seen today for urinary frequency.  Diagnoses and all orders for this visit:  Overactive bladder Negative UA. Discussed frequency and urgency is likely due to overactive bladder. Will try oxybutynin as below.  -     Urinalysis, Routine w reflex microscopic -     oxybutynin (DITROPAN-XL) 5 MG 24 hr tablet; Take 1 tablet (5 mg total) by mouth at  bedtime.  Acute bilateral low back pain without sciatica Likely muscle strain. Advil, tylenol, heat prn.   Elevated BP without diagnosis of hypertension Recent normal labs. Recheck BP in 4 weeks.    Return in about 4 weeks (around 11/28/2020) for medication follow up, BP check.  The patient indicates understanding of these issues and agrees with the plan.  Gabriel Earing, FNP

## 2020-10-31 NOTE — Patient Instructions (Signed)
Overactive Bladder, Adult  Overactive bladder is a condition in which a person has a sudden and frequent need to urinate. A person might also leak urine if he or she cannot get to the bathroom fast enough (urinary incontinence). Sometimes, symptoms can interfere with work or social activities. What are the causes? Overactive bladder is associated with poor nerve signals between your bladder and your brain. Your bladder may get the signal to empty before it is full. You may also have very sensitive muscles that make your bladder squeeze too soon. This condition may also be caused by other factors, such as:  Medical conditions: ? Urinary tract infection. ? Infection of nearby tissues. ? Prostate enlargement. ? Bladder stones, inflammation, or tumors. ? Diabetes. ? Muscle or nerve weakness, especially from these conditions:  A spinal cord injury.  Stroke.  Multiple sclerosis.  Parkinson's disease.  Other causes: ? Surgery on the uterus or urethra. ? Drinking too much caffeine or alcohol. ? Certain medicines, especially those that eliminate extra fluid in the body (diuretics). ? Constipation. What increases the risk? You may be at greater risk for overactive bladder if you:  Are an older adult.  Smoke.  Are going through menopause.  Have prostate problems.  Have a neurological disease, such as stroke, dementia, Parkinson's disease, or multiple sclerosis (MS).  Eat or drink alcohol, spicy food, caffeine, and other things that irritate the bladder.  Are overweight or obese. What are the signs or symptoms? Symptoms of this condition include a sudden, strong urge to urinate. Other symptoms include:  Leaking urine.  Urinating 8 or more times a day.  Waking up to urinate 2 or more times overnight. How is this diagnosed? This condition may be diagnosed based on:  Your symptoms and medical history.  A physical exam.  Blood or urine tests to check for possible causes,  such as infection. You may also need to see a health care provider who specializes in urinary tract problems. This is called a urologist. How is this treated? Treatment for overactive bladder depends on the cause of your condition and whether it is mild or severe. Treatment may include:  Bladder training, such as: ? Learning to control the urge to urinate by following a schedule to urinate at regular intervals. ? Doing Kegel exercises to strengthen the pelvic floor muscles that support your bladder.  Special devices, such as: ? Biofeedback. This uses sensors to help you become aware of your body's signals. ? Electrical stimulation. This uses electrodes placed inside the body (implanted) or outside the body. These electrodes send gentle pulses of electricity to strengthen the nerves or muscles that control the bladder. ? Women may use a plastic device, called a pessary, that fits into the vagina and supports the bladder.  Medicines, such as: ? Antibiotics to treat bladder infection. ? Antispasmodics to stop the bladder from releasing urine at the wrong time. ? Tricyclic antidepressants to relax bladder muscles. ? Injections of botulinum toxin type A directly into the bladder tissue to relax bladder muscles.  Surgery, such as: ? A device may be implanted to help manage the nerve signals that control urination. ? An electrode may be implanted to stimulate electrical signals in the bladder. ? A procedure may be done to change the shape of the bladder. This is done only in very severe cases. Follow these instructions at home: Eating and drinking  Make diet or lifestyle changes recommended by your health care provider. These may include: ? Drinking fluids   throughout the day and not only with meals. ? Cutting down on caffeine or alcohol. ? Eating a healthy and balanced diet to prevent constipation. This may include:  Choosing foods that are high in fiber, such as beans, whole grains, and  fresh fruits and vegetables.  Limiting foods that are high in fat and processed sugars, such as fried and sweet foods.   Lifestyle  Lose weight if needed.  Do not use any products that contain nicotine or tobacco. These include cigarettes, chewing tobacco, and vaping devices, such as e-cigarettes. If you need help quitting, ask your health care provider.   General instructions  Take over-the-counter and prescription medicines only as told by your health care provider.  If you were prescribed an antibiotic medicine, take it as told by your health care provider. Do not stop taking the antibiotic even if you start to feel better.  Use any implants or pessary as told by your health care provider.  If needed, wear pads to absorb urine leakage.  Keep a log to track how much and when you drink, and when you need to urinate. This will help your health care provider monitor your condition.  Keep all follow-up visits. This is important. Contact a health care provider if:  You have a fever or chills.  Your symptoms do not get better with treatment.  Your pain and discomfort get worse.  You have more frequent urges to urinate. Get help right away if:  You are not able to control your bladder. Summary  Overactive bladder refers to a condition in which a person has a sudden and frequent need to urinate.  Several conditions may lead to an overactive bladder.  Treatment for overactive bladder depends on the cause and severity of your condition.  Making lifestyle changes, doing Kegel exercises, keeping a log, and taking medicines can help with this condition. This information is not intended to replace advice given to you by your health care provider. Make sure you discuss any questions you have with your health care provider. Document Revised: 02/18/2020 Document Reviewed: 02/18/2020 Elsevier Patient Education  2021 [employer] Inc.  

## 2020-11-27 ENCOUNTER — Ambulatory Visit: Payer: 59 | Admitting: Nurse Practitioner

## 2020-11-28 ENCOUNTER — Encounter: Payer: Self-pay | Admitting: Nurse Practitioner

## 2021-01-12 ENCOUNTER — Ambulatory Visit: Payer: Self-pay | Admitting: Orthopedic Surgery

## 2021-02-04 NOTE — Progress Notes (Signed)
DUE TO COVID-19 ONLY ONE VISITOR IS ALLOWED TO COME WITH YOU AND STAY IN THE WAITING ROOM ONLY DURING PRE OP AND PROCEDURE DAY OF SURGERY.  2 VISITOR  MAY VISIT WITH YOU AFTER SURGERY IN YOUR PRIVATE ROOM DURING VISITING HOURS ONLY!  YOU NEED TO HAVE A COVID 19 TEST ON___  02/17/2021 ___@_  @_from  8am-3pm _____, THIS TEST MUST BE DONE BEFORE SURGERY,  Covid test is done at [address] [place], [place] [address].  This is a drive thru.  No appt required. Please see map.                 Your procedure is scheduled on:  02/18/2021   Report to [facility] Main  Entrance   Report to admitting at   0600 AM     Call this number if you have problems the morning of surgery [phone]    REMEMBER: NO  SOLID FOOD CANDY OR GUM AFTER MIDNIGHT. CLEAR LIQUIDS UNTIL    0530am      . NOTHING BY MOUTH EXCEPT CLEAR LIQUIDS UNTIL     0530am   . PLEASE FINISH ENSURE DRINK PER SURGEON ORDER  WHICH NEEDS TO BE COMPLETED AT    0530am   .      CLEAR LIQUID DIET   Foods Allowed                                                                    Coffee and tea, regular and decaf                            Fruit ices (not with fruit pulp)                                      Iced Popsicles                                    Carbonated beverages, regular and diet                                    Cranberry, grape and apple juices Sports drinks like Gatorade Lightly seasoned clear broth or consume(fat free) Sugar, honey syrup ___________________________________________________________________      BRUSH YOUR TEETH MORNING OF SURGERY AND RINSE YOUR MOUTH OUT, NO CHEWING GUM CANDY OR MINTS.     Take these medicines the morning of surgery with A SIP OF WATER: none   DO NOT TAKE ANY DIABETIC MEDICATIONS DAY OF YOUR SURGERY                               You may not have any metal on your body including hair pins and              piercings  Do not wear jewelry, make-up, lotions, powders or  perfumes, deodorant  Do not wear nail polish on your fingernails.  Do not shave  48 hours prior to surgery.              Men may shave face and neck.   Do not bring valuables to the hospital. [place].  Contacts, dentures or bridgework may not be worn into surgery.  Leave suitcase in the car. After surgery it may be brought to your room.     Patients discharged the day of surgery will not be allowed to drive home. IF YOU ARE HAVING SURGERY AND GOING HOME THE SAME DAY, YOU MUST HAVE AN ADULT TO DRIVE YOU HOME AND BE WITH YOU FOR 24 HOURS. YOU MAY GO HOME BY TAXI OR UBER OR ORTHERWISE, BUT AN ADULT MUST ACCOMPANY YOU HOME AND STAY WITH YOU FOR 24 HOURS.  Name and phone number of your driver:  Special Instructions: N/A              Please read over the following fact sheets you were given: _____________________________________________________________________  [facility] - Preparing for Surgery Before surgery, you can play an important role.  Because skin is not sterile, your skin needs to be as free of germs as possible.  You can reduce the number of germs on your skin by washing with CHG (chlorahexidine gluconate) soap before surgery.  CHG is an antiseptic cleaner which kills germs and bonds with the skin to continue killing germs even after washing. Please DO NOT use if you have an allergy to CHG or antibacterial soaps.  If your skin becomes reddened/irritated stop using the CHG and inform your nurse when you arrive at Short Stay. Do not shave (including legs and underarms) for at least 48 hours prior to the first CHG shower.  You may shave your face/neck. Please follow these instructions carefully:  1.  Shower with CHG Soap the night before surgery and the  morning of Surgery.  2.  If you choose to wash your hair, wash your hair first as usual with your  normal  shampoo.  3.  After you shampoo, rinse your hair and body thoroughly  to remove the  shampoo.                           4.  Use CHG as you would any other liquid soap.  You can apply chg directly  to the skin and wash                       Gently with a scrungie or clean washcloth.  5.  Apply the CHG Soap to your body ONLY FROM THE NECK DOWN.   Do not use on face/ open                           Wound or open sores. Avoid contact with eyes, ears mouth and genitals (private parts).                       Wash face,  Genitals (private parts) with your normal soap.             6.  Wash thoroughly, paying special attention to the area where your surgery  will be performed.  7.  Thoroughly rinse your body with  warm water from the neck down.  8.  DO NOT shower/wash with your normal soap after using and rinsing off  the CHG Soap.                9.  Pat yourself dry with a clean towel.            10.  Wear clean pajamas.            11.  Place clean sheets on your bed the night of your first shower and do not  sleep with pets. Day of Surgery : Do not apply any lotions/deodorants the morning of surgery.  Please wear clean clothes to the hospital/surgery center.  FAILURE TO FOLLOW THESE INSTRUCTIONS MAY RESULT IN THE CANCELLATION OF YOUR SURGERY PATIENT SIGNATURE_________________________________  NURSE SIGNATURE__________________________________  ________________________________________________________________________

## 2021-02-10 ENCOUNTER — Other Ambulatory Visit: Payer: Self-pay

## 2021-02-10 ENCOUNTER — Encounter (HOSPITAL_COMMUNITY)
Admission: RE | Admit: 2021-02-10 | Discharge: 2021-02-10 | Disposition: A | Discharge status: Home / Self Care | Payer: 59 | Source: Ambulatory Visit | Attending: Specialist | Admitting: Specialist

## 2021-02-10 ENCOUNTER — Encounter (HOSPITAL_COMMUNITY): Payer: Self-pay

## 2021-02-10 DIAGNOSIS — Z01812 Encounter for preprocedural laboratory examination: Secondary | ICD-10-CM | POA: Diagnosis present

## 2021-02-10 HISTORY — DX: Essential (primary) hypertension: I10

## 2021-02-10 LAB — CBC
HCT: 39 % (ref 36.0–46.0)
Hemoglobin: 12.4 g/dL (ref 12.0–15.0)
MCH: 28.9 pg (ref 26.0–34.0)
MCHC: 31.8 g/dL (ref 30.0–36.0)
MCV: 90.9 fL (ref 80.0–100.0)
Platelets: 307 10*3/uL (ref 150–400)
RBC: 4.29 MIL/uL (ref 3.87–5.11)
RDW: 13 % (ref 11.5–15.5)
WBC: 5.8 10*3/uL (ref 4.0–10.5)
nRBC: 0 % (ref 0.0–0.2)

## 2021-02-10 LAB — BASIC METABOLIC PANEL
Anion gap: 5 (ref 5–15)
BUN: 12 mg/dL (ref 6–20)
CO2: 28 mmol/L (ref 22–32)
Calcium: 9.2 mg/dL (ref 8.9–10.3)
Chloride: 108 mmol/L (ref 98–111)
Creatinine, Ser: 0.73 mg/dL (ref 0.44–1.00)
GFR, Estimated: >60 mL/min (ref >60)
Glucose, Bld: 102 mg/dL — ABNORMAL HIGH (ref 70–99)
Potassium: 4.3 mmol/L (ref 3.5–5.1)
Sodium: 141 mmol/L (ref 135–145)

## 2021-02-10 LAB — SURGICAL PCR SCREEN
MRSA, PCR: NEGATIVE
Staphylococcus aureus: NEGATIVE

## 2021-02-10 NOTE — Progress Notes (Addendum)
Anesthesia Review:  PCP: 10/31/20- [facility] DR Paulene Floor- per pt had to be cleared by MD  . Jeanene Erb and spoke with Cordelia Pen at [facility] - Clearance dated 09/19/20 on chart.  Surgery was originally scheduled for 11/2020 and had to be cancelled for ? Per pt request.  LOV 10/31/20 Tiffany Morgan,NP  Preop clearance 09/19/20  Cardiologist : none  Chest x-ray : 2V 09/13/20  EKG : 09/19/20  Echo : Stress test: Cardiac Cath :  Activity level: can do a flight of stairs without difficulty  Sleep Study/ CPAP : none  Fasting Blood Sugar :      / Checks Blood Sugar -- times a day:   Blood Thinner/ Instructions /Last Dose: ASA / Instructions/ Last Dose :   Covid test 02/17/21 before 1100am .

## 2021-02-13 ENCOUNTER — Ambulatory Visit: Payer: Self-pay | Admitting: Orthopedic Surgery

## 2021-02-13 NOTE — H&P (View-Only) (Signed)
Yolanda Gordon is an 60 y.o. female.   Chief Complaint: right knee pain HPI: The patient comes in for her H&P. The patient is scheduled for a right total knee replacement by Dr. Beane on 10/17/20 at [facility].  Dr. Beane and the patient mutually agreed to proceed with a total knee replacement. Risks and benefits of the procedure were discussed including stiffness, suboptimal range of motion, persistent pain, infection requiring removal of prosthesis and reinsertion, need for prophylactic antibiotics in the future, for example, dental procedures, possible need for manipulation, revision in the future and also anesthetic complications including DVT, PE, etc. We discussed the perioperative course, time in the hospital, postoperative recovery and the need for elevation to control swelling. We also discussed the predicted range of motion and the probability that squatting and kneeling would be unobtainable in the future. In addition, postoperative anticoagulation was discussed. We have obtained preoperative medical clearance as necessary. Provided illustrated handout and discussed it in detail. They will enroll in the total joint replacement educational forum at the hospital.  Past Medical History:  Diagnosis Date   Arthritis    Hypertension     Past Surgical History:  Procedure Laterality Date   UTERINE FIBROID EMBOLIZATION      No family history on file. Social History:  reports that she has never smoked. She has never used smokeless tobacco. She reports that she does not drink alcohol and does not use drugs.  Allergies: No Known Allergies  (Not in a hospital admission)   No results found for this or any previous visit (from the past 48 hour(s)). No results found.  Review of Systems  Constitutional: Negative.   HENT: Negative.    Eyes: Negative.   Respiratory: Negative.    Cardiovascular: Negative.   Gastrointestinal: Negative.   Genitourinary: Negative.    Musculoskeletal:  Positive for arthralgias and joint swelling.  Neurological: Negative.    There were no vitals taken for this visit. Physical Exam Constitutional:      Appearance: Normal appearance.  HENT:     Head: Normocephalic.     Right Ear: External ear normal.     Left Ear: External ear normal.     Nose: Nose normal.     Mouth/Throat:     Pharynx: Oropharynx is clear.  Eyes:     Conjunctiva/sclera: Conjunctivae normal.  Cardiovascular:     Rate and Rhythm: Normal rate and regular rhythm.     Pulses: Normal pulses.  Pulmonary:     Effort: Pulmonary effort is normal.  Abdominal:     General: Bowel sounds are normal.  Musculoskeletal:     Cervical back: Normal range of motion.     Comments: Right knee exquisitely tender medial joint line. Positive McMurray. Range 0-110. No DVT. Ipsilateral hip and ankle exam is unremarkable. Good quad strength. Mild patellofemoral pain compression. Walks an antalgic gait.  Skin:    General: Skin is warm and dry.  Neurological:     Mental Status: She is alert.     Assessment/Plan Impression: Right knee osteoarthritis end-stage  Plan:Pt with end-stage right knee DJD, bone-on-bone, refractory to conservative tx, scheduled for right total knee replacement by Dr. Beane on May 6. We again discussed the procedure itself as well as risks, complications and alternatives, including but not limited to DVT, PE, infx, bleeding, failure of procedure, need for secondary procedure including manipulation, nerve injury, ongoing pain/symptoms, anesthesia risk, even stroke or death. Also discussed typical post-op protocols, activity restrictions, need   for PT, flexion/extension exercises, time out of work. Discussed need for DVT ppx post-op per protocol. Discussed dental ppx and infx prevention. Also discussed limitations post-operatively such as kneeling and squatting. All questions were answered. Patient desires to proceed with surgery as scheduled.  Will  hold supplements, ASA and NSAIDs accordingly. Will remain NPO after midnight the night before surgery. Will present to [facility] for pre-op testing. Anticipate hospital stay to include at least 2 midnights given medical history and to ensure proper pain control. Plan aspirin for DVT ppx post-op. Plan Percocet, Robaxin, Colace, Miralax. Plan home with HHPT post-op with family members at home for assistance then transition to outpatient PT when ready. Will follow up 10-14 days post-op for suture removal and xrays.  Plan Right total knee replacement  Dorothy Spark, PA-C for Dr Shelle Iron 02/13/2021, 10:38 AM

## 2021-02-13 NOTE — H&P (Signed)
Yolanda Gordon is an 60 y.o. female.   Chief Complaint: right knee pain HPI: The patient comes in for her H&P. The patient is scheduled for a right total knee replacement by Dr. Shelle Iron on 10/17/20 at [facility].  Dr. Shelle Iron and the patient mutually agreed to proceed with a total knee replacement. Risks and benefits of the procedure were discussed including stiffness, suboptimal range of motion, persistent pain, infection requiring removal of prosthesis and reinsertion, need for prophylactic antibiotics in the future, for example, dental procedures, possible need for manipulation, revision in the future and also anesthetic complications including DVT, PE, etc. We discussed the perioperative course, time in the hospital, postoperative recovery and the need for elevation to control swelling. We also discussed the predicted range of motion and the probability that squatting and kneeling would be unobtainable in the future. In addition, postoperative anticoagulation was discussed. We have obtained preoperative medical clearance as necessary. Provided illustrated handout and discussed it in detail. They will enroll in the total joint replacement educational forum at the hospital.  Past Medical History:  Diagnosis Date   Arthritis    Hypertension     Past Surgical History:  Procedure Laterality Date   UTERINE FIBROID EMBOLIZATION      No family history on file. Social History:  reports that she has never smoked. She has never used smokeless tobacco. She reports that she does not drink alcohol and does not use drugs.  Allergies: No Known Allergies  (Not in a hospital admission)   No results found for this or any previous visit (from the past 48 hour(s)). No results found.  Review of Systems  Constitutional: Negative.   HENT: Negative.    Eyes: Negative.   Respiratory: Negative.    Cardiovascular: Negative.   Gastrointestinal: Negative.   Genitourinary: Negative.    Musculoskeletal:  Positive for arthralgias and joint swelling.  Neurological: Negative.    There were no vitals taken for this visit. Physical Exam Constitutional:      Appearance: Normal appearance.  HENT:     Head: Normocephalic.     Right Ear: External ear normal.     Left Ear: External ear normal.     Nose: Nose normal.     Mouth/Throat:     Pharynx: Oropharynx is clear.  Eyes:     Conjunctiva/sclera: Conjunctivae normal.  Cardiovascular:     Rate and Rhythm: Normal rate and regular rhythm.     Pulses: Normal pulses.  Pulmonary:     Effort: Pulmonary effort is normal.  Abdominal:     General: Bowel sounds are normal.  Musculoskeletal:     Cervical back: Normal range of motion.     Comments: Right knee exquisitely tender medial joint line. Positive McMurray. Range 0-110. No DVT. Ipsilateral hip and ankle exam is unremarkable. Good quad strength. Mild patellofemoral pain compression. Walks an antalgic gait.  Skin:    General: Skin is warm and dry.  Neurological:     Mental Status: She is alert.     Assessment/Plan Impression: Right knee osteoarthritis end-stage  Plan:Pt with end-stage right knee DJD, bone-on-bone, refractory to conservative tx, scheduled for right total knee replacement by Dr. Shelle Iron on May 6. We again discussed the procedure itself as well as risks, complications and alternatives, including but not limited to DVT, PE, infx, bleeding, failure of procedure, need for secondary procedure including manipulation, nerve injury, ongoing pain/symptoms, anesthesia risk, even stroke or death. Also discussed typical post-op protocols, activity restrictions, need  for PT, flexion/extension exercises, time out of work. Discussed need for DVT ppx post-op per protocol. Discussed dental ppx and infx prevention. Also discussed limitations post-operatively such as kneeling and squatting. All questions were answered. Patient desires to proceed with surgery as scheduled.  Will  hold supplements, ASA and NSAIDs accordingly. Will remain NPO after midnight the night before surgery. Will present to [facility] for pre-op testing. Anticipate hospital stay to include at least 2 midnights given medical history and to ensure proper pain control. Plan aspirin for DVT ppx post-op. Plan Percocet, Robaxin, Colace, Miralax. Plan home with HHPT post-op with family members at home for assistance then transition to outpatient PT when ready. Will follow up 10-14 days post-op for suture removal and xrays.  Plan Right total knee replacement  Dorothy Spark, PA-C for Dr Shelle Iron 02/13/2021, 10:38 AM

## 2021-02-17 ENCOUNTER — Other Ambulatory Visit: Payer: Self-pay | Admitting: Specialist

## 2021-02-17 LAB — SARS CORONAVIRUS 2 (TAT 6-24 HRS): SARS Coronavirus 2: NEGATIVE

## 2021-02-17 NOTE — Anesthesia Preprocedure Evaluation (Addendum)
Anesthesia Evaluation  Patient identified by MRN, date of birth, ID band Patient awake    Reviewed: Allergy & Precautions, NPO status , Patient's Chart, lab work & pertinent test results  History of Anesthesia Complications Negative for: history of anesthetic complications  Airway Mallampati: II  TM Distance: >3 FB Neck ROM: Full    Dental  (+) Edentulous Upper, Edentulous Lower   Pulmonary neg pulmonary ROS,    Pulmonary exam normal        Cardiovascular hypertension,  Rhythm:Regular Rate:Tachycardia     Neuro/Psych negative neurological ROS  negative psych ROS   GI/Hepatic negative GI ROS, Neg liver ROS,   Endo/Other  negative endocrine ROS  Renal/GU negative Renal ROS     Musculoskeletal  (+) Arthritis ,   Abdominal   Peds  Hematology  Plt 307k    Anesthesia Other Findings Covid test negative   Reproductive/Obstetrics                           Anesthesia Physical Anesthesia Plan  ASA: 2  Anesthesia Plan: Spinal   Post-op Pain Management:  Regional for Post-op pain   Induction:   PONV Risk Score and Plan: 2 and Treatment may vary due to age or medical condition and Propofol infusion  Airway Management Planned: Natural Airway and Simple Face Mask  Additional Equipment: None  Intra-op Plan:   Post-operative Plan:   Informed Consent: I have reviewed the patients History and Physical, chart, labs and discussed the procedure including the risks, benefits and alternatives for the proposed anesthesia with the patient or authorized representative who has indicated his/her understanding and acceptance.       Plan Discussed with: CRNA and Anesthesiologist  Anesthesia Plan Comments: (Labs reviewed, platelets acceptable. Discussed risks and benefits of spinal, including spinal/epidural hematoma, infection, failed block, and PDPH. Patient expressed understanding and wished to  proceed. )       Anesthesia Quick Evaluation

## 2021-02-18 ENCOUNTER — Ambulatory Visit (HOSPITAL_COMMUNITY): Payer: 59

## 2021-02-18 ENCOUNTER — Other Ambulatory Visit: Payer: Self-pay

## 2021-02-18 ENCOUNTER — Encounter (HOSPITAL_COMMUNITY): Payer: Self-pay | Admitting: Specialist

## 2021-02-18 ENCOUNTER — Encounter (HOSPITAL_COMMUNITY)
Admission: RE | Disposition: A | Discharge status: Home / Self Care | Payer: Self-pay | Source: Home / Self Care | Attending: Specialist

## 2021-02-18 ENCOUNTER — Ambulatory Visit (HOSPITAL_COMMUNITY): Payer: 59 | Admitting: Anesthesiology

## 2021-02-18 ENCOUNTER — Observation Stay (HOSPITAL_COMMUNITY)
Admission: RE | Admit: 2021-02-18 | Discharge: 2021-02-20 | Disposition: A | Discharge status: Home / Self Care | Payer: 59 | Attending: Specialist | Admitting: Specialist

## 2021-02-18 ENCOUNTER — Ambulatory Visit (HOSPITAL_COMMUNITY): Payer: 59 | Admitting: Physician Assistant

## 2021-02-18 DIAGNOSIS — I10 Essential (primary) hypertension: Secondary | ICD-10-CM | POA: Diagnosis not present

## 2021-02-18 DIAGNOSIS — M1711 Unilateral primary osteoarthritis, right knee: Secondary | ICD-10-CM | POA: Diagnosis not present

## 2021-02-18 DIAGNOSIS — Z96659 Presence of unspecified artificial knee joint: Secondary | ICD-10-CM

## 2021-02-18 HISTORY — PX: TOTAL KNEE ARTHROPLASTY: SHX125

## 2021-02-18 SURGERY — ARTHROPLASTY, KNEE, TOTAL
Anesthesia: Spinal | Site: Knee | Laterality: Right

## 2021-02-18 MED ORDER — BUPIVACAINE-EPINEPHRINE (PF) 0.5% -1:200000 IJ SOLN
INTRAMUSCULAR | Status: DC | PRN
  Administered 2021-02-18: 30 mL

## 2021-02-18 MED ORDER — DOCUSATE SODIUM 100 MG PO CAPS
100.0000 mg | ORAL_CAPSULE | Freq: Two times a day (BID) | ORAL | Status: DC
  Administered 2021-02-18: 100 mg via ORAL
  Filled 2021-02-18 (×2): qty 1

## 2021-02-18 MED ORDER — BUPIVACAINE LIPOSOME 1.3 % IJ SUSP
INTRAMUSCULAR | Status: AC
  Filled 2021-02-18: qty 20

## 2021-02-18 MED ORDER — BISACODYL 5 MG PO TBEC: 5.0000 mg | DELAYED_RELEASE_TABLET | Freq: Every day | ORAL | Status: DC | PRN

## 2021-02-18 MED ORDER — FENTANYL CITRATE PF 50 MCG/ML IJ SOSY
PREFILLED_SYRINGE | INTRAMUSCULAR | Status: AC
  Filled 2021-02-18: qty 3

## 2021-02-18 MED ORDER — CEFAZOLIN SODIUM-DEXTROSE 2-4 GM/100ML-% IV SOLN
2.0000 g | Freq: Four times a day (QID) | INTRAVENOUS | Status: AC
Start: 2021-02-18 — End: 2021-02-18
  Administered 2021-02-18 (×2): 2 g via INTRAVENOUS
  Filled 2021-02-18 (×2): qty 100

## 2021-02-18 MED ORDER — PHENOL 1.4 % MT LIQD: 1.0000 | OROMUCOSAL | Status: DC | PRN

## 2021-02-18 MED ORDER — DIPHENHYDRAMINE HCL 12.5 MG/5ML PO ELIX
12.5000 mg | ORAL_SOLUTION | ORAL | Status: DC | PRN
Start: 2021-02-18 — End: 2021-02-20

## 2021-02-18 MED ORDER — OXYCODONE HCL 5 MG PO TABS: 5.0000 mg | ORAL_TABLET | Freq: Once | ORAL | Status: DC | PRN

## 2021-02-18 MED ORDER — MIDAZOLAM HCL 2 MG/2ML IJ SOLN
INTRAMUSCULAR | Status: DC | PRN
  Administered 2021-02-18: 2 mg via INTRAVENOUS

## 2021-02-18 MED ORDER — POLYETHYLENE GLYCOL 3350 17 G PO PACK
17.0000 g | PACK | Freq: Every day | ORAL | Status: DC
  Administered 2021-02-19 – 2021-02-20 (×2): 17 g via ORAL
  Filled 2021-02-18 (×2): qty 1

## 2021-02-18 MED ORDER — FENTANYL CITRATE (PF) 100 MCG/2ML IJ SOLN
INTRAMUSCULAR | Status: AC
  Filled 2021-02-18: qty 2

## 2021-02-18 MED ORDER — BUPIVACAINE IN DEXTROSE 0.75-8.25 % IT SOLN
INTRATHECAL | Status: DC | PRN
  Administered 2021-02-18: 1.8 mL via INTRATHECAL

## 2021-02-18 MED ORDER — FENTANYL CITRATE (PF) 100 MCG/2ML IJ SOLN
INTRAMUSCULAR | Status: DC | PRN
  Administered 2021-02-18 (×2): 25 ug via INTRAVENOUS
  Administered 2021-02-18 (×2): 50 ug via INTRAVENOUS
  Administered 2021-02-18 (×2): 25 ug via INTRAVENOUS

## 2021-02-18 MED ORDER — BUPIVACAINE-EPINEPHRINE 0.25% -1:200000 IJ SOLN
INTRAMUSCULAR | Status: DC | PRN
  Administered 2021-02-18: 30 mL

## 2021-02-18 MED ORDER — METHOCARBAMOL 500 MG PO TABS
500.0000 mg | ORAL_TABLET | Freq: Four times a day (QID) | ORAL | Status: DC | PRN
  Administered 2021-02-18 – 2021-02-20 (×5): 500 mg via ORAL
  Filled 2021-02-18 (×5): qty 1

## 2021-02-18 MED ORDER — SODIUM CHLORIDE 0.9 % IR SOLN
Status: DC | PRN
  Administered 2021-02-18 (×2): 1000 mL

## 2021-02-18 MED ORDER — PROPOFOL 10 MG/ML IV BOLUS
INTRAVENOUS | Status: DC | PRN
  Administered 2021-02-18 (×5): 20 mg via INTRAVENOUS

## 2021-02-18 MED ORDER — MIDAZOLAM HCL 2 MG/2ML IJ SOLN
INTRAMUSCULAR | Status: AC
  Filled 2021-02-18: qty 2

## 2021-02-18 MED ORDER — RISAQUAD PO CAPS
1.0000 | ORAL_CAPSULE | Freq: Every day | ORAL | Status: DC
  Filled 2021-02-18: qty 1

## 2021-02-18 MED ORDER — METHOCARBAMOL 500 MG IVPB - SIMPLE MED
500.0000 mg | Freq: Four times a day (QID) | INTRAVENOUS | Status: DC | PRN
  Filled 2021-02-18: qty 50

## 2021-02-18 MED ORDER — MORPHINE SULFATE (PF) 2 MG/ML IV SOLN: 0.5000 mg | INTRAVENOUS | Status: DC | PRN

## 2021-02-18 MED ORDER — ACETAMINOPHEN 500 MG PO TABS
500.0000 mg | ORAL_TABLET | Freq: Four times a day (QID) | ORAL | Status: AC
  Administered 2021-02-18 – 2021-02-19 (×3): 500 mg via ORAL
  Filled 2021-02-18 (×3): qty 1

## 2021-02-18 MED ORDER — METOCLOPRAMIDE HCL 5 MG/ML IJ SOLN
5.0000 mg | Freq: Three times a day (TID) | INTRAMUSCULAR | Status: DC | PRN
Start: 2021-02-18 — End: 2021-02-20

## 2021-02-18 MED ORDER — MENTHOL 3 MG MT LOZG: 1.0000 | LOZENGE | OROMUCOSAL | Status: DC | PRN

## 2021-02-18 MED ORDER — LIDOCAINE 2% (20 MG/ML) 5 ML SYRINGE
INTRAMUSCULAR | Status: AC
  Filled 2021-02-18: qty 5

## 2021-02-18 MED ORDER — ONDANSETRON HCL 4 MG PO TABS: 4.0000 mg | ORAL_TABLET | Freq: Four times a day (QID) | ORAL | Status: DC | PRN

## 2021-02-18 MED ORDER — PROPOFOL 500 MG/50ML IV EMUL
INTRAVENOUS | Status: DC | PRN
  Administered 2021-02-18: 80 ug/kg/min via INTRAVENOUS

## 2021-02-18 MED ORDER — ONDANSETRON HCL 4 MG/2ML IJ SOLN
INTRAMUSCULAR | Status: AC
  Filled 2021-02-18: qty 2

## 2021-02-18 MED ORDER — ONDANSETRON HCL 4 MG/2ML IJ SOLN
4.0000 mg | Freq: Four times a day (QID) | INTRAMUSCULAR | Status: DC | PRN
  Administered 2021-02-18: 4 mg via INTRAVENOUS
  Filled 2021-02-18: qty 2

## 2021-02-18 MED ORDER — PROPOFOL 1000 MG/100ML IV EMUL
INTRAVENOUS | Status: AC
  Filled 2021-02-18: qty 100

## 2021-02-18 MED ORDER — CHLORHEXIDINE GLUCONATE CLOTH 2 % EX PADS: 6.0000 | MEDICATED_PAD | Freq: Every day | CUTANEOUS | Status: DC

## 2021-02-18 MED ORDER — KCL IN DEXTROSE-NACL 20-5-0.45 MEQ/L-%-% IV SOLN
INTRAVENOUS | Status: AC
  Filled 2021-02-18: qty 1000

## 2021-02-18 MED ORDER — METOCLOPRAMIDE HCL 5 MG PO TABS: 5.0000 mg | ORAL_TABLET | Freq: Three times a day (TID) | ORAL | Status: DC | PRN

## 2021-02-18 MED ORDER — BUPIVACAINE-EPINEPHRINE (PF) 0.25% -1:200000 IJ SOLN
INTRAMUSCULAR | Status: AC
  Filled 2021-02-18: qty 30

## 2021-02-18 MED ORDER — DEXAMETHASONE SODIUM PHOSPHATE 10 MG/ML IJ SOLN
INTRAMUSCULAR | Status: AC
  Filled 2021-02-18: qty 1

## 2021-02-18 MED ORDER — ASPIRIN 81 MG PO CHEW
81.0000 mg | CHEWABLE_TABLET | Freq: Two times a day (BID) | ORAL | Status: DC
  Administered 2021-02-19 – 2021-02-20 (×3): 81 mg via ORAL
  Filled 2021-02-18 (×3): qty 1

## 2021-02-18 MED ORDER — ACETAMINOPHEN 325 MG PO TABS: 325.0000 mg | ORAL_TABLET | Freq: Four times a day (QID) | ORAL | Status: DC | PRN

## 2021-02-18 MED ORDER — ONDANSETRON HCL 4 MG/2ML IJ SOLN
INTRAMUSCULAR | Status: DC | PRN
  Administered 2021-02-18: 4 mg via INTRAVENOUS

## 2021-02-18 MED ORDER — ACETAMINOPHEN 10 MG/ML IV SOLN
1000.0000 mg | INTRAVENOUS | Status: AC
  Administered 2021-02-18: 1000 mg via INTRAVENOUS
  Filled 2021-02-18: qty 100

## 2021-02-18 MED ORDER — IRRISEPT - 450ML BOTTLE WITH 0.05% CHG IN STERILE WATER, USP 99.95% OPTIME
TOPICAL | Status: DC | PRN
  Administered 2021-02-18: 300 mL

## 2021-02-18 MED ORDER — LACTATED RINGERS IV SOLN: INTRAVENOUS | Status: DC

## 2021-02-18 MED ORDER — STERILE WATER FOR IRRIGATION IR SOLN
Status: DC | PRN
  Administered 2021-02-18: 2000 mL

## 2021-02-18 MED ORDER — SODIUM CHLORIDE 0.9% FLUSH
INTRAVENOUS | Status: DC | PRN
  Administered 2021-02-18: 40 mL

## 2021-02-18 MED ORDER — TRANEXAMIC ACID-NACL 1000-0.7 MG/100ML-% IV SOLN
1000.0000 mg | INTRAVENOUS | Status: AC
  Administered 2021-02-18: 1000 mg via INTRAVENOUS
  Filled 2021-02-18: qty 100

## 2021-02-18 MED ORDER — LIDOCAINE HCL (CARDIAC) PF 100 MG/5ML IV SOSY
PREFILLED_SYRINGE | INTRAVENOUS | Status: DC | PRN
  Administered 2021-02-18: 60 mg via INTRAVENOUS

## 2021-02-18 MED ORDER — HYDROCODONE-ACETAMINOPHEN 7.5-325 MG PO TABS: 1.0000 | ORAL_TABLET | ORAL | Status: DC | PRN

## 2021-02-18 MED ORDER — 0.9 % SODIUM CHLORIDE (POUR BTL) OPTIME
TOPICAL | Status: DC | PRN
  Administered 2021-02-18: 1000 mL

## 2021-02-18 MED ORDER — ALUM & MAG HYDROXIDE-SIMETH 200-200-20 MG/5ML PO SUSP: 30.0000 mL | ORAL | Status: DC | PRN

## 2021-02-18 MED ORDER — FENTANYL CITRATE PF 50 MCG/ML IJ SOSY
25.0000 ug | PREFILLED_SYRINGE | INTRAMUSCULAR | Status: DC | PRN
  Administered 2021-02-18 (×2): 50 ug via INTRAVENOUS

## 2021-02-18 MED ORDER — PROMETHAZINE HCL 25 MG/ML IJ SOLN: 6.2500 mg | INTRAMUSCULAR | Status: DC | PRN

## 2021-02-18 MED ORDER — BUPIVACAINE LIPOSOME 1.3 % IJ SUSP
INTRAMUSCULAR | Status: DC | PRN
  Administered 2021-02-18: 20 mL

## 2021-02-18 MED ORDER — HYDROCODONE-ACETAMINOPHEN 5-325 MG PO TABS
1.0000 | ORAL_TABLET | ORAL | Status: DC | PRN
  Administered 2021-02-18: 2 via ORAL
  Administered 2021-02-18: 1 via ORAL
  Administered 2021-02-19 (×4): 2 via ORAL
  Administered 2021-02-20: 1 via ORAL
  Administered 2021-02-20: 2 via ORAL
  Filled 2021-02-18: qty 1
  Filled 2021-02-18 (×2): qty 2
  Filled 2021-02-18: qty 1
  Filled 2021-02-18 (×4): qty 2

## 2021-02-18 MED ORDER — CEFAZOLIN SODIUM-DEXTROSE 2-4 GM/100ML-% IV SOLN
2.0000 g | INTRAVENOUS | Status: AC
  Administered 2021-02-18: 2 g via INTRAVENOUS
  Filled 2021-02-18: qty 100

## 2021-02-18 MED ORDER — OXYCODONE HCL 5 MG/5ML PO SOLN: 5.0000 mg | Freq: Once | ORAL | Status: DC | PRN

## 2021-02-18 MED ORDER — DEXAMETHASONE SODIUM PHOSPHATE 10 MG/ML IJ SOLN
INTRAMUSCULAR | Status: DC | PRN
  Administered 2021-02-18: 5 mg via INTRAVENOUS

## 2021-02-18 SURGICAL SUPPLY — 76 items
ATTUNE MED DOME PAT 32 KNEE (Knees) ×2 IMPLANT
ATTUNE PSFEM RTSZ4 NARCEM KNEE (Femur) ×2 IMPLANT
ATTUNE PSRP INSR SZ4 5 KNEE (Insert) ×2 IMPLANT
BAG COUNTER SPONGE SURGICOUNT (BAG) ×4 IMPLANT
BAG DECANTER FOR FLEXI CONT (MISCELLANEOUS) ×2 IMPLANT
BAG ZIPLOCK 12X15 (MISCELLANEOUS) IMPLANT
BASE TIBIAL ROT PLAT SZ 3 KNEE (Knees) ×1 IMPLANT
BLADE SAW SGTL 11.0X1.19X90.0M (BLADE) ×2 IMPLANT
BLADE SAW SGTL 13.0X1.19X90.0M (BLADE) ×2 IMPLANT
BLADE SURG SZ10 CARB STEEL (BLADE) ×4 IMPLANT
BNDG COHESIVE 4X5 TAN ST LF (GAUZE/BANDAGES/DRESSINGS) ×2 IMPLANT
BNDG ELASTIC 4X5.8 VLCR STR LF (GAUZE/BANDAGES/DRESSINGS) ×2 IMPLANT
BNDG ELASTIC 6X5.8 VLCR STR LF (GAUZE/BANDAGES/DRESSINGS) ×2 IMPLANT
CEMENT HV SMART SET (Cement) ×4 IMPLANT
COVER SURGICAL LIGHT HANDLE (MISCELLANEOUS) ×2 IMPLANT
CUFF TOURN SGL QUICK 34 (TOURNIQUET CUFF) ×2
CUFF TRNQT CYL 34X4.125X (TOURNIQUET CUFF) ×1 IMPLANT
DECANTER SPIKE VIAL GLASS SM (MISCELLANEOUS) ×2 IMPLANT
DRAPE INCISE IOBAN 66X45 STRL (DRAPES) ×6 IMPLANT
DRAPE ORTHO SPLIT 77X108 STRL (DRAPES) ×4
DRAPE SHEET LG 3/4 BI-LAMINATE (DRAPES) ×4 IMPLANT
DRAPE SURG ORHT 6 SPLT 77X108 (DRAPES) ×2 IMPLANT
DRAPE U-SHAPE 47X51 STRL (DRAPES) ×2 IMPLANT
DRSG AQUACEL AG ADV 3.5X10 (GAUZE/BANDAGES/DRESSINGS) ×2 IMPLANT
DRSG TEGADERM 4X4.75 (GAUZE/BANDAGES/DRESSINGS) IMPLANT
DURAPREP 26ML APPLICATOR (WOUND CARE) ×2 IMPLANT
ELECT BLADE TIP CTD 4 INCH (ELECTRODE) ×2 IMPLANT
ELECT REM PT RETURN 15FT ADLT (MISCELLANEOUS) ×2 IMPLANT
EVACUATOR 1/8 PVC DRAIN (DRAIN) IMPLANT
GAUZE SPONGE 2X2 8PLY STRL LF (GAUZE/BANDAGES/DRESSINGS) IMPLANT
GLOVE SRG 8 PF TXTR STRL LF DI (GLOVE) ×1 IMPLANT
GLOVE SURG POLYISO LF SZ7.5 (GLOVE) ×4 IMPLANT
GLOVE SURG POLYISO LF SZ8 (GLOVE) ×4 IMPLANT
GLOVE SURG UNDER POLY LF SZ7.5 (GLOVE) ×2 IMPLANT
GLOVE SURG UNDER POLY LF SZ8 (GLOVE) ×2
GOWN STRL REUS W/TWL XL LVL3 (GOWN DISPOSABLE) ×4 IMPLANT
HANDPIECE INTERPULSE COAX TIP (DISPOSABLE) ×2
HEMOSTAT SPONGE AVITENE ULTRA (HEMOSTASIS) IMPLANT
HOLDER FOLEY CATH W/STRAP (MISCELLANEOUS) ×2 IMPLANT
IMMOBILIZER KNEE 20 (SOFTGOODS) ×2
IMMOBILIZER KNEE 20 THIGH 36 (SOFTGOODS) ×1 IMPLANT
JET LAVAGE IRRISEPT WOUND (IRRIGATION / IRRIGATOR) ×2
KIT TURNOVER KIT A (KITS) ×2 IMPLANT
LAVAGE JET IRRISEPT WOUND (IRRIGATION / IRRIGATOR) ×1 IMPLANT
MANIFOLD NEPTUNE II (INSTRUMENTS) ×2 IMPLANT
NDL SAFETY ECLIPSE 18X1.5 (NEEDLE) ×1 IMPLANT
NEEDLE HYPO 18GX1.5 SHARP (NEEDLE) ×2
NS IRRIG 1000ML POUR BTL (IV SOLUTION) IMPLANT
PACK TOTAL KNEE CUSTOM (KITS) ×2 IMPLANT
PIN DRILL FIX HALF THREAD (BIT) ×2 IMPLANT
PIN STEINMAN FIXATION KNEE (PIN) ×2 IMPLANT
PROTECTOR NERVE ULNAR (MISCELLANEOUS) ×2 IMPLANT
SAW OSC TIP CART 19.5X105X1.3 (SAW) ×2 IMPLANT
SEALER BIPOLAR AQUA 6.0 (INSTRUMENTS) ×2 IMPLANT
SET HNDPC FAN SPRY TIP SCT (DISPOSABLE) ×1 IMPLANT
SPONGE GAUZE 2X2 STER 10/PKG (GAUZE/BANDAGES/DRESSINGS)
SPONGE SURGIFOAM ABS GEL 100 (HEMOSTASIS) IMPLANT
STAPLER VISISTAT (STAPLE) IMPLANT
STRIP CLOSURE SKIN 1/2X4 (GAUZE/BANDAGES/DRESSINGS) ×4 IMPLANT
SUT BONE WAX W31G (SUTURE) ×2 IMPLANT
SUT MNCRL AB 4-0 PS2 18 (SUTURE) IMPLANT
SUT STRATAFIX 0 PDS 27 VIOLET (SUTURE) ×2
SUT VIC AB 1 CT1 27 (SUTURE) ×4
SUT VIC AB 1 CT1 27XBRD ANTBC (SUTURE) ×2 IMPLANT
SUT VIC AB 1 CTX 36 (SUTURE)
SUT VIC AB 1 CTX36XBRD ANBCTR (SUTURE) IMPLANT
SUT VIC AB 2-0 CT1 27 (SUTURE) ×4
SUT VIC AB 2-0 CT1 TAPERPNT 27 (SUTURE) ×2 IMPLANT
SUTURE STRATFX 0 PDS 27 VIOLET (SUTURE) ×1 IMPLANT
SYR 50ML LL SCALE MARK (SYRINGE) ×2 IMPLANT
TIBIAL BASE ROT PLAT SZ 3 KNEE (Knees) ×2 IMPLANT
TOWER CARTRIDGE SMART MIX (DISPOSABLE) ×2 IMPLANT
TRAY FOLEY MTR SLVR 16FR STAT (SET/KITS/TRAYS/PACK) ×2 IMPLANT
WATER STERILE IRR 1000ML POUR (IV SOLUTION) ×2 IMPLANT
WIPE CHG CHLORHEXIDINE 2% (PERSONAL CARE ITEMS) ×2 IMPLANT
WRAP KNEE MAXI GEL POST OP (GAUZE/BANDAGES/DRESSINGS) ×2 IMPLANT

## 2021-02-18 NOTE — Plan of Care (Signed)
  Problem: Activity: Goal: Ability to avoid complications of mobility impairment will improve Outcome: Progressing   Problem: Clinical Measurements: Goal: Postoperative complications will be avoided or minimized Outcome: Progressing   Problem: Pain Management: Goal: Pain level will decrease with appropriate interventions Outcome: Progressing   Problem: Skin Integrity: Goal: Will show signs of wound healing Outcome: Progressing   

## 2021-02-18 NOTE — Brief Op Note (Signed)
02/18/2021  10:41 AM  PATIENT:  Yolanda Gordon  60 y.o. female  PRE-OPERATIVE DIAGNOSIS:  Right knee degenerative joint disease  POST-OPERATIVE DIAGNOSIS:  Right knee degenerative joint disease  PROCEDURE:  Procedure(s): TOTAL KNEE ARTHROPLASTY (Right)  SURGEON:  Surgeon(s) and Role:    Jene Every, MD - Primary  PHYSICIAN ASSISTANT:   ASSISTANTS: Bissell   ANESTHESIA:   spinal  EBL:  25 mL   BLOOD ADMINISTERED:none  DRAINS: none   LOCAL MEDICATIONS USED:  MARCAINE     SPECIMEN:  No Specimen  DISPOSITION OF SPECIMEN:  N/A  COUNTS:  YES  TOURNIQUET:   Total Tourniquet Time Documented: Thigh (Right) - 55 minutes Total: Thigh (Right) - 55 minutes   DICTATION: .Other Dictation: Dictation Number   [id number]  PLAN OF CARE: Admit for overnight observation  PATIENT DISPOSITION:  PACU - hemodynamically stable.   Delay start of Pharmacological VTE agent (>24hrs) due to surgical blood loss or risk of bleeding: no

## 2021-02-18 NOTE — Anesthesia Procedure Notes (Signed)
Anesthesia Regional Block: Adductor canal block   Pre-Anesthetic Checklist: , timeout performed,  Correct Patient, Correct Site, Correct Laterality,  Correct Procedure, Correct Position, site marked,  Risks and benefits discussed,  Surgical consent,  Pre-op evaluation,  At surgeon's request and post-op pain management  Laterality: Right  Prep: chloraprep       Needles:  Injection technique: Single-shot  Needle Type: Echogenic Needle     Needle Length: 10cm  Needle Gauge: 21     Additional Needles:   Narrative:  Start time: 02/18/2021 8:03 AM End time: 02/18/2021 8:07 AM Injection made incrementally with aspirations every 5 mL.  Performed by: Personally  Anesthesiologist: Beryle Lathe, MD  Additional Notes: No pain on injection. No increased resistance to injection. Injection made in 5cc increments. Good needle visualization. Patient tolerated the procedure well.

## 2021-02-18 NOTE — Anesthesia Procedure Notes (Signed)
Spinal  Patient location during procedure: OR Start time: 02/18/2021 8:33 AM End time: 02/18/2021 8:36 AM Reason for block: surgical anesthesia Staffing Performed: resident/CRNA  Anesthesiologist: Beryle Lathe, MD Resident/CRNA: Yolonda Kida, CRNA Preanesthetic Checklist Completed: patient identified, IV checked, site marked, risks and benefits discussed, surgical consent, monitors and equipment checked, pre-op evaluation and timeout performed Spinal Block Patient position: sitting Prep: DuraPrep Patient monitoring: blood pressure, continuous pulse ox and heart rate Approach: midline Location: L3-4 Injection technique: single-shot Needle Needle type: Pencan  Needle gauge: 24 G Assessment Sensory level: T4 Events: CSF return

## 2021-02-18 NOTE — Op Note (Signed)
NAME: Yolanda Gordon, Yolanda Gordon MEDICAL RECORD NO: [id number] ACCOUNT NO: [id number] DATE OF BIRTH: 01-13-1961 FACILITY: [facility] LOCATION: [facility] PHYSICIAN: Javier Docker, MD  Operative Report   DATE OF PROCEDURE: 02/18/2021  PREOPERATIVE DIAGNOSIS:  End-stage osteoarthrosis of the right knee.  POSTOPERATIVE DIAGNOSIS:  End-stage osteoarthrosis of the right knee.  PROCEDURE PERFORMED:  Right total knee arthroplasty utilizing DePuy rotating platform Attune, 4 femur, 3 tibia, 5 mm insert, 32 patella.  ANESTHESIA:  Spinal.  ASSISTANT:  Andrez Grime, PA  HISTORY:  This is a 60 year old female who has persistent disabling right knee pain.  MRI indicating bicondylar high-grade loss on the medial femoral condyle and tibial plateau with subchondral cystic changes.  She also had a meniscal root avulsion.   Less mechanical symptoms and medial joint pain, had been refractory to conservative treatment.  She was indicated for placement of the degenerated joint.  She had also severe patellofemoral arthrosis as well.  Risks and benefits discussed including  bleeding, infection, damage to neurovascular structures, no change in symptoms, worsening symptoms, DVT, PE, anesthetic complications, need for revision, arthrofibrosis, etc.  TECHNIQUE:  The patient in the supine position.  After induction of adequate spinal anesthesia, 2 grams Kefzol, the right lower extremity was prepped and draped and exsanguinated in the usual sterile fashion.  Thigh tourniquet inflated to 225 mmHg.   Midline incision was then made over the knee.  Full thickness flaps developed. Medial parapatellar arthrotomy performed.  Copious portions of clear synovial fluid was evacuated.  Patella was everted and knee was flexed.  Severe grade IV changes noted on  the femoral condyle medially, the entire weightbearing surface and tibial plateau.  Also, beneath the patella and femoral notch.  I resected the remnants of the medial and lateral  menisci and the medial meniscus was detached posteriorly.  I resected the  ACL as well.  A notch was created above the femoral notch and used a drill to enter the femoral canal, in line with the femur, was copiously irrigated.  I placed T-handle and a 5-degree intramedullary guide for the distal femoral cut, 9 off the distal  femur.  This was pinned, performed the cut, protecting soft tissues at all time.  It was sized then off the anterior cortex to a 4.  This was then pinned in 3 degrees of external rotation.  I placed the 4 cutting block on the femur and anterior,  posterior, chamfer cuts were then performed, soft tissues protected posteriorly at all times with curved Cregos.  I then subluxed the tibia, recessed the PCL.  Any remnants of the menisci were removed, cauterized the geniculates.  The defect which was  medial was noted, 2 off the defect, external alignment guide parallel to the shaft, 3 degrees slope, bisecting the tibiotalar joint.  This was then pinned.  I performed that cut, protecting the popliteus and posterior structures at all times.  I then  checked our extension gap with a 5 mm block and it was satisfactory as was in flexion.  Subluxed the tibia, measured it to a better fit at a 3, just to the medial aspect of tibial tubercle, maximizing her coverage.  This was then pinned.  I harvested  bone, essentially from the tibial canal and then impacted into the distal femur.  I then drilled it centrally and used our punch guide.  I then turned attention back to the femur, used the box cutting guide.  This was then applied bisecting the canal,  performed our box  cut.  This was then rasped.  Then, I took a trial 4 femur and impacted it, it fit flush.  I then placed a 5 mm insert, reduced it, had full extension, full flexion, good stability to varus, valgus stressing at 0 and 30 degrees.   Negative anterior drawer.  Attention then turned to the patella.  There was wear laterally and medially.   This was measured to a 20, I planed it to a 14.  This was then measured to a 32, medializing it, drilled the peg holes, placed a trial patella,  reduced it.  I had excellent patellofemoral tracking.  I then removed all trials, checked posteriorly, copiously irrigated the wound with pulsatile lavage and bony surfaces.  Popliteus and the capsule were intact, cauterized geniculates again.  Flexed  the knee, subluxed the tibial surfaces, thoroughly dried.  Cement was mixed on the back table under vacuum.  I then injected into the tibial canal, digitally pressurizing the cement and then impacted the tibial tray that had cement on it as well.   Redundant cement removed, cemented and impacted the femur.  Both sides had cement on them.  Redundant cement removed and placed a 5 insert, reduced it, held in axial load throughout the curing of the cement, placed it on a basin.  Redundant cement was  then removed.  I clamped and cemented the patella.  Removed redundant cement and placed Marcaine with epinephrine into the joint, during the curing of the cement, the wound was covered.  Following the curing of the cement, tourniquet was deflated at 55  minutes.  Any minor bleeding was cauterized.  I had full flexion and full extension and good stability.  I chose the 5, this insert was then removed.  I then flexed the knee and subluxed the tibia.  Redundant cement was removed meticulously.  Copiously  irrigated the joint with pulsatile lavage and IrriSept and then irrigation following that as well, placed a 5 permanent insert and reduced the knee, had full extension and full flexion, and good stability to varus, valgus stressing at 0 and 30 degrees,  negative anterior drawer and excellent patellofemoral tracking.  I then in slight flexion reapproximated patellar arthrotomy with 1 Vicryl in interrupted figure-of-eight sutures oversewn with a running Stratafix.  Following this, we had excellent  patellofemoral tracking.   Following that, Exparel was injected into the capsule and quadriceps.  Copiously irrigated subcutaneous tissue.  They were closed with 2-0 and Monocryl in appropriate layers.  She had flexion to gravity at 90 degrees following  closure.  Wound was dressed sterilely, placed in immobilizer and transported to the recovery room in satisfactory condition.  The patient tolerated the procedure well and no complications.  Assistant, Andrez Grime, [place], was used throughout the case for patient positioning, closure, assisting with the insertion of the prosthesis and cement removal.   SHW D: 02/18/2021 10:49:29 am T: 02/18/2021 11:57:00 am  JOB: [id number]/ [id number]

## 2021-02-18 NOTE — Transfer of Care (Signed)
Immediate Anesthesia Transfer of Care Note  Patient: Yolanda Gordon  Procedure(s) Performed: TOTAL KNEE ARTHROPLASTY (Right: Knee)  Patient Location: PACU  Anesthesia Type:Spinal  Level of Consciousness: awake, drowsy and patient cooperative  Airway & Oxygen Therapy: Patient Spontanous Breathing and Patient connected to face mask oxygen  Post-op Assessment: Report given to RN and Post -op Vital signs reviewed and stable  Post vital signs: Reviewed and stable--pt shivering; warm blankets applied  Last Vitals:  Vitals Value Taken Time  BP 143/106 02/18/21 1053  Temp    Pulse 96 02/18/21 1053  Resp 20 02/18/21 1053  SpO2 100 % 02/18/21 1053  Vitals shown include unvalidated device data.  Last Pain:  Vitals:   02/18/21 0656  TempSrc: Oral         Complications: No notable events documented.

## 2021-02-18 NOTE — Interval H&P Note (Signed)
History and Physical Interval Note:  02/18/2021 8:06 AM  Yolanda Gordon  has presented today for surgery, with the diagnosis of Right knee degenerative joint disease.  The various methods of treatment have been discussed with the patient and family. After consideration of risks, benefits and other options for treatment, the patient has consented to  Procedure(s): TOTAL KNEE ARTHROPLASTY (Right) as a surgical intervention.  The patient's history has been reviewed, patient examined, no change in status, stable for surgery.  I have reviewed the patient's chart and labs.  Questions were answered to the patient's satisfaction.     Javier Docker

## 2021-02-18 NOTE — Anesthesia Postprocedure Evaluation (Signed)
Anesthesia Post Note  Patient: Yolanda Gordon  Procedure(s) Performed: TOTAL KNEE ARTHROPLASTY (Right: Knee)     Patient location during evaluation: PACU Anesthesia Type: Spinal Level of consciousness: awake and alert Pain management: pain level controlled Vital Signs Assessment: post-procedure vital signs reviewed and stable Respiratory status: spontaneous breathing, nonlabored ventilation and respiratory function stable Cardiovascular status: stable and blood pressure returned to baseline Anesthetic complications: no   No notable events documented.  Last Vitals:  Vitals:   02/18/21 1115 02/18/21 1130  BP: (!) 176/88 (!) 140/109  Pulse: 79 (!) 103  Resp: (!) 24 (!) 21  Temp:    SpO2: 100% 100%    Last Pain:  Vitals:   02/18/21 1130  TempSrc:   PainSc: Asleep                 Beryle Lathe

## 2021-02-18 NOTE — Evaluation (Signed)
Physical Therapy Evaluation Patient Details Name: Yolanda Gordon MRN: [id number] DOB: 04/10/1961 Today's Date: 02/18/2021   History of Present Illness  Patient is 60 y.o. female s/p Rt TKA on 02/18/21 with PMH significant for OA, HTN.  Clinical Impression  Yolanda Gordon is a 60 y.o. female POD 0 s/p Rt TKA. Patient reports independence with mobility at baseline. Patient is now limited by functional impairments (see PT problem list below) and requires min assist for transfers and gait with RW. Patient was able to ambulate ~8 feet with RW and min assist but was limited by nausea and pain. Patient instructed in exercise to facilitate circulation to reduce risk of DVT. Patient will benefit from continued skilled PT interventions to address impairments and progress towards PLOF. Acute PT will follow to progress mobility and stair training in preparation for safe discharge home.     Follow Up Recommendations Follow surgeon's recommendation for DC plan and follow-up therapies;Home health PT    Equipment Recommendations  None recommended by PT    Recommendations for Other Services       Precautions / Restrictions Precautions Precautions: Fall Restrictions Weight Bearing Restrictions: No RLE Weight Bearing: Weight bearing as tolerated      Mobility  Bed Mobility Overal bed mobility: Needs Assistance Bed Mobility: Supine to Sit     Supine to sit: Min assist;HOB elevated     General bed mobility comments: cues to use bed rail and assist to support Rt LE moving off EOB due to pain.    Transfers Overall transfer level: Needs assistance Equipment used: Rolling walker (2 wheeled) Transfers: Sit to/from Stand Sit to Stand: Min assist         General transfer comment: cues for hand placement for power up and light assist to complete rise.  Ambulation/Gait Ambulation/Gait assistance: Min assist Gait Distance (Feet): 8 Feet Assistive device: Rolling walker (2  wheeled) Gait Pattern/deviations: Step-to pattern;Decreased stride length;Decreased weight shift to right Gait velocity: decr   General Gait Details: cues for safe step to pattern and proximity to RW, no overt LOB noted. pt distance limited by weak/nauseous sensation. symptoms resolved with sitting and ice chips.  Stairs            Wheelchair Mobility    Modified Rankin (Stroke Patients Only)       Balance Overall balance assessment: Needs assistance Sitting-balance support: Feet supported Sitting balance-Leahy Scale: Good     Standing balance support: During functional activity;Bilateral upper extremity supported Standing balance-Leahy Scale: Fair                               Pertinent Vitals/Pain Pain Assessment: 0-10 Pain Score: 4  Pain Location: Rt knee Pain Descriptors / Indicators: Aching Pain Intervention(s): Limited activity within patient's tolerance;Monitored during session;Premedicated before session;Repositioned;Ice applied    Home Living Family/patient expects to be discharged to:: Private residence Living Arrangements: Spouse/significant other Available Help at Discharge: Family Type of Home: House Home Access: Stairs to enter Entrance Stairs-Rails: Lawyer of Steps: 3 Home Layout: One level Home Equipment: Toilet riser;Walker - 2 wheels;Cane - single point;Wheelchair - manual      Prior Function Level of Independence: Independent         Comments: occasional use of SPC     Hand Dominance   Dominant Hand: Right    Extremity/Trunk Assessment   Upper Extremity Assessment Upper Extremity Assessment: Overall WFL for tasks assessed  Lower Extremity Assessment Lower Extremity Assessment: RLE deficits/detail RLE Deficits / Details: godo quad activation, unable to doe SLR due to pain RLE: Unable to fully assess due to pain RLE Sensation: WNL RLE Coordination: WNL    Cervical / Trunk  Assessment Cervical / Trunk Assessment: Normal  Communication   Communication: No difficulties  Cognition Arousal/Alertness: Awake/alert Behavior During Therapy: WFL for tasks assessed/performed Overall Cognitive Status: Within Functional Limits for tasks assessed                                        General Comments      Exercises Total Joint Exercises Ankle Circles/Pumps: AROM;Both;20 reps;Seated   Assessment/Plan    PT Assessment Patient needs continued PT services  PT Problem List Decreased strength;Decreased range of motion;Decreased activity tolerance;Decreased balance;Decreased mobility;Decreased knowledge of use of DME;Decreased safety awareness;Pain;Decreased knowledge of precautions       PT Treatment Interventions DME instruction;Gait training;Stair training;Functional mobility training;Therapeutic activities;Therapeutic exercise;Balance training;Patient/family education    PT Goals (Current goals can be found in the Care Plan section)  Acute Rehab PT Goals Patient Stated Goal: stop hurting and recover independence PT Goal Formulation: With patient Time For Goal Achievement: 02/25/21 Potential to Achieve Goals: Good    Frequency 7X/week   Barriers to discharge        Co-evaluation               AM-PAC PT "6 Clicks" Mobility  Outcome Measure Help needed turning from your back to your side while in a flat bed without using bedrails?: A Little Help needed moving from lying on your back to sitting on the side of a flat bed without using bedrails?: A Little Help needed moving to and from a bed to a chair (including a wheelchair)?: A Little Help needed standing up from a chair using your arms (e.g., wheelchair or bedside chair)?: A Little Help needed to walk in hospital room?: A Little Help needed climbing 3-5 steps with a railing? : A Little 6 Click Score: 18    End of Session Equipment Utilized During Treatment: Gait belt Activity  Tolerance: Patient tolerated treatment well Patient left: in chair;with call bell/phone within reach;with chair alarm set Nurse Communication: Mobility status PT Visit Diagnosis: Muscle weakness (generalized) (M62.81);Difficulty in walking, not elsewhere classified (R26.2);Pain Pain - Right/Left: Right Pain - part of body: Knee    Time: 1540-1557 PT Time Calculation (min) (ACUTE ONLY): 17 min   Charges:   PT Evaluation $PT Eval Low Complexity: 1 Low          Wynn Maudlin, DPT Acute Rehabilitation Services Office [phone] Pager [phone]   Anitra Lauth 02/18/2021, 5:46 PM

## 2021-02-18 NOTE — Discharge Instructions (Signed)

## 2021-02-19 ENCOUNTER — Encounter (HOSPITAL_COMMUNITY): Payer: Self-pay | Admitting: Specialist

## 2021-02-19 DIAGNOSIS — M1711 Unilateral primary osteoarthritis, right knee: Secondary | ICD-10-CM | POA: Diagnosis not present

## 2021-02-19 LAB — CBC
HCT: 35.4 % — ABNORMAL LOW (ref 36.0–46.0)
Hemoglobin: 11.8 g/dL — ABNORMAL LOW (ref 12.0–15.0)
MCH: 29.5 pg (ref 26.0–34.0)
MCHC: 33.3 g/dL (ref 30.0–36.0)
MCV: 88.5 fL (ref 80.0–100.0)
Platelets: 304 10*3/uL (ref 150–400)
RBC: 4 MIL/uL (ref 3.87–5.11)
RDW: 13 % (ref 11.5–15.5)
WBC: 14.1 10*3/uL — ABNORMAL HIGH (ref 4.0–10.5)
nRBC: 0 % (ref 0.0–0.2)

## 2021-02-19 LAB — BASIC METABOLIC PANEL
Anion gap: 7 (ref 5–15)
BUN: 11 mg/dL (ref 6–20)
CO2: 26 mmol/L (ref 22–32)
Calcium: 8.4 mg/dL — ABNORMAL LOW (ref 8.9–10.3)
Chloride: 96 mmol/L — ABNORMAL LOW (ref 98–111)
Creatinine, Ser: 0.82 mg/dL (ref 0.44–1.00)
GFR, Estimated: >60 mL/min (ref >60)
Glucose, Bld: 132 mg/dL — ABNORMAL HIGH (ref 70–99)
Potassium: 4.2 mmol/L (ref 3.5–5.1)
Sodium: 129 mmol/L — ABNORMAL LOW (ref 135–145)

## 2021-02-19 MED ORDER — DOCUSATE SODIUM 50 MG/5ML PO LIQD
100.0000 mg | Freq: Two times a day (BID) | ORAL | Status: DC
  Administered 2021-02-19 – 2021-02-20 (×3): 100 mg via ORAL
  Filled 2021-02-19 (×3): qty 10

## 2021-02-19 NOTE — Plan of Care (Signed)
  Problem: Education: Goal: Knowledge of the prescribed therapeutic regimen will improve Outcome: Progressing   Problem: Activity: Goal: Ability to avoid complications of mobility impairment will improve Outcome: Progressing   Problem: Pain Management: Goal: Pain level will decrease with appropriate interventions Outcome: Progressing   

## 2021-02-19 NOTE — Plan of Care (Signed)
  Problem: Activity: Goal: Ability to avoid complications of mobility impairment will improve Outcome: Progressing   Problem: Clinical Measurements: Goal: Postoperative complications will be avoided or minimized Outcome: Progressing   Problem: Pain Management: Goal: Pain level will decrease with appropriate interventions Outcome: Progressing   Problem: Skin Integrity: Goal: Will show signs of wound healing Outcome: Progressing   

## 2021-02-19 NOTE — TOC Transition Note (Signed)
Transition of Care [facility]) - CM/SW Discharge Note  Patient Details  Name: Yolanda Gordon MRN: [id number] Date of Birth: 04/26/1961  [facility] CM/SW Contact:  Sherie Don, LCSW Phone Number: 02/19/2021, 1:46 PM  Clinical Narrative: Patient is expected to discharge after working with PT. CSW met with patient review discharge plan and needs. Patient will need HHPT arranged. Patient has a rolling walker, toilet riser, wheelchair, and crutches so there are no DME needs at this time. CSW made [facility] referral to [facility] with [place]. TOC signing off.  Final next level of care: [place] Barriers to Discharge: No Barriers Identified  Patient Goals and CMS Choice Patient states their goals for this hospitalization and ongoing recovery are:: Discharge home with [place] CMS Medicare.gov Compare Post Acute Care list provided to:: Patient Choice offered to / list presented to : Patient  Discharge Plan and Services         DME Arranged: N/A DME Agency: NA HH Arranged: PT HH Agency: [place] Date [place]: 02/19/21 Time [place]: [id number] Representative spoke with at [place]: [place]  Readmission Risk Interventions No flowsheet data found.

## 2021-02-19 NOTE — Progress Notes (Signed)
Physical Therapy Treatment Patient Details Name: Yolanda Gordon MRN: [id number] DOB: 09-Jul-1960 Today's Date: 02/19/2021    History of Present Illness Patient is 60 y.o. female s/p Rt TKA on 02/18/21 with PMH significant for OA, HTN.    PT Comments    Pt very cooperative and progressing steadily with mobility.  Pt hopeful for dc home tomorrow.   Follow Up Recommendations  Follow surgeon's recommendation for DC plan and follow-up therapies;Home health PT     Equipment Recommendations  None recommended by PT    Recommendations for Other Services       Precautions / Restrictions Precautions Precautions: Fall Restrictions Weight Bearing Restrictions: No RLE Weight Bearing: Weight bearing as tolerated    Mobility  Bed Mobility Overal bed mobility: Needs Assistance Bed Mobility: Supine to Sit;Sit to Supine     Supine to sit: Min guard Sit to supine: Min assist   General bed mobility comments: assist for R LE into bed    Transfers Overall transfer level: Needs assistance Equipment used: Rolling walker (2 wheeled) Transfers: Sit to/from Stand Sit to Stand: Min guard         General transfer comment: cues for LE management and use of UEs to self assist  Ambulation/Gait Ambulation/Gait assistance: Min assist;Min guard Gait Distance (Feet): 115 Feet Assistive device: Rolling walker (2 wheeled) Gait Pattern/deviations: Step-to pattern;Decreased stride length;Decreased weight shift to right;Antalgic;Trunk flexed;Shuffle Gait velocity: decr   General Gait Details: cues for sequence, posture, stride length, position from RW, and to decrease pace for safety   Stairs             Wheelchair Mobility    Modified Rankin (Stroke Patients Only)       Balance Overall balance assessment: Needs assistance Sitting-balance support: Feet supported Sitting balance-Leahy Scale: Good     Standing balance support: During functional activity;Bilateral upper  extremity supported Standing balance-Leahy Scale: Fair                              Cognition Arousal/Alertness: Awake/alert Behavior During Therapy: WFL for tasks assessed/performed;Impulsive Overall Cognitive Status: Within Functional Limits for tasks assessed                                        Exercises Total Joint Exercises Ankle Circles/Pumps: AROM;Both;20 reps;Supine Quad Sets: AROM;Both;10 reps;Supine Heel Slides: AAROM;Right;15 reps;Supine Straight Leg Raises: AAROM;Right;15 reps;Supine    General Comments        Pertinent Vitals/Pain Pain Assessment: 0-10 Pain Score: 5  Pain Location: Rt knee Pain Descriptors / Indicators: Aching;Sore Pain Intervention(s): Limited activity within patient's tolerance;Monitored during session;Premedicated before session;Ice applied    Home Living                      Prior Function            PT Goals (current goals can now be found in the care plan section) Acute Rehab PT Goals Patient Stated Goal: Regain iND PT Goal Formulation: With patient Time For Goal Achievement: 02/25/21 Potential to Achieve Goals: Good Progress towards PT goals: Progressing toward goals    Frequency    7X/week      PT Plan Current plan remains appropriate    Co-evaluation              AM-PAC PT "6 Clicks"  Mobility   Outcome Measure  Help needed turning from your back to your side while in a flat bed without using bedrails?: A Little Help needed moving from lying on your back to sitting on the side of a flat bed without using bedrails?: A Little Help needed moving to and from a bed to a chair (including a wheelchair)?: A Little Help needed standing up from a chair using your arms (e.g., wheelchair or bedside chair)?: A Little Help needed to walk in hospital room?: A Little Help needed climbing 3-5 steps with a railing? : A Little 6 Click Score: 18    End of Session Equipment Utilized  During Treatment: Gait belt;Right knee immobilizer Activity Tolerance: Patient tolerated treatment well Patient left: in bed;with bed alarm set;with call bell/phone within reach;with nursing/sitter in room Nurse Communication: Mobility status PT Visit Diagnosis: Muscle weakness (generalized) (M62.81);Difficulty in walking, not elsewhere classified (R26.2);Pain Pain - Right/Left: Right Pain - part of body: Knee     Time: 1779-3903 PT Time Calculation (min) (ACUTE ONLY): 35 min  Charges:  $Gait Training: 8-22 mins $Therapeutic Exercise: 8-22 mins                     Mauro Kaufmann PT Acute Rehabilitation Services Pager [phone] Office [phone]    Merlyn Conley 02/19/2021, 3:16 PM

## 2021-02-19 NOTE — Progress Notes (Signed)
Physical Therapy Treatment Patient Details Name: Yolanda Gordon MRN: [id number] DOB: [birth year] Today's Date: 02/19/2021    History of Present Illness Patient is 61 y.o. female s/p Rt TKA on 02/18/21 with PMH significant for OA, HTN.    PT Comments    Pt very motivated and progressing with mobility.  Pt performed therex program with assist, up to bathroom for toileting, and ambulated increased distance in hall.     Follow Up Recommendations  Follow surgeon's recommendation for DC plan and follow-up therapies;Home health PT     Equipment Recommendations  None recommended by PT    Recommendations for Other Services       Precautions / Restrictions Precautions Precautions: Fall Restrictions Weight Bearing Restrictions: No RLE Weight Bearing: Weight bearing as tolerated    Mobility  Bed Mobility Overal bed mobility: Needs Assistance Bed Mobility: Supine to Sit     Supine to sit: Min guard     General bed mobility comments: Increased time but no use of bed rails    Transfers Overall transfer level: Needs assistance Equipment used: Rolling walker (2 wheeled) Transfers: Sit to/from Stand Sit to Stand: Min assist;Min guard         General transfer comment: cues for LE management and use of UEs to self assist  Ambulation/Gait Ambulation/Gait assistance: Min assist;Min guard Gait Distance (Feet): 65 Feet (and additional 15' into bathroom) Assistive device: Rolling walker (2 wheeled) Gait Pattern/deviations: Step-to pattern;Decreased stride length;Decreased weight shift to right;Antalgic;Trunk flexed;Shuffle Gait velocity: decr   General Gait Details: cues for sequence, posture, stride length, position from RW, and to decrease pace for safety   Stairs             Wheelchair Mobility    Modified Rankin (Stroke Patients Only)       Balance Overall balance assessment: Needs assistance Sitting-balance support: Feet supported Sitting balance-Leahy  Scale: Good     Standing balance support: During functional activity;Bilateral upper extremity supported Standing balance-Leahy Scale: Fair                              Cognition Arousal/Alertness: Awake/alert Behavior During Therapy: WFL for tasks assessed/performed;Impulsive Overall Cognitive Status: Within Functional Limits for tasks assessed                                        Exercises Total Joint Exercises Ankle Circles/Pumps: AROM;Both;20 reps;Supine Quad Sets: AROM;Both;10 reps;Supine Heel Slides: AAROM;Right;15 reps;Supine Straight Leg Raises: AAROM;Right;15 reps;Supine Goniometric ROM: AAROM -5 - 40    General Comments        Pertinent Vitals/Pain Pain Assessment: 0-10 Pain Score: 5  Pain Location: Rt knee Pain Descriptors / Indicators: Aching;Sore Pain Intervention(s): Limited activity within patient's tolerance;Monitored during session;Premedicated before session;Ice applied    Home Living                      Prior Function            PT Goals (current goals can now be found in the care plan section) Acute Rehab PT Goals Patient Stated Goal: Regain iND PT Goal Formulation: With patient Time For Goal Achievement: 02/25/21 Potential to Achieve Goals: Good Progress towards PT goals: Progressing toward goals    Frequency    7X/week      PT Plan Current plan remains appropriate  Co-evaluation              AM-PAC PT "6 Clicks" Mobility   Outcome Measure  Help needed turning from your back to your side while in a flat bed without using bedrails?: A Little Help needed moving from lying on your back to sitting on the side of a flat bed without using bedrails?: A Little Help needed moving to and from a bed to a chair (including a wheelchair)?: A Little Help needed standing up from a chair using your arms (e.g., wheelchair or bedside chair)?: A Little Help needed to walk in hospital room?: A  Little Help needed climbing 3-5 steps with a railing? : A Little 6 Click Score: 18    End of Session Equipment Utilized During Treatment: Gait belt;Right knee immobilizer Activity Tolerance: Patient tolerated treatment well Patient left: in chair;with call bell/phone within reach;with chair alarm set Nurse Communication: Mobility status PT Visit Diagnosis: Muscle weakness (generalized) (M62.81);Difficulty in walking, not elsewhere classified (R26.2);Pain Pain - Right/Left: Right Pain - part of body: Knee     Time: 7342-8768 PT Time Calculation (min) (ACUTE ONLY): 28 min  Charges:  $Gait Training: 8-22 mins $Therapeutic Exercise: 8-22 mins                     Yolanda Gordon PT Acute Rehabilitation Services Pager [phone] Office [phone]    Yolanda Gordon 02/19/2021, 9:35 AM

## 2021-02-19 NOTE — Progress Notes (Signed)
Subjective: 1 Day Post-Op Procedure(s) (LRB): TOTAL KNEE ARTHROPLASTY (Right) Patient reports pain as 3 on 0-10 scale.   Denies CP or SOB.  Voiding without difficulty. Positive flatus. Objective: Vital signs in last 24 hours: Temp:  [97.7 F (36.5 C)-98.8 F (37.1 C)] 97.7 F (36.5 C) (09/08 0700) Pulse Rate:  [64-103] 80 (09/08 0700) Resp:  [14-24] 16 (09/08 0700) BP: (135-176)/(68-109) 141/76 (09/08 0700) SpO2:  [99 %-100 %] 99 % (09/08 0700) Weight:  [70.3 kg] 70.3 kg (09/07 1502)  Intake/Output from previous day: 09/07 0701 - 09/08 0700 In: 4852 [P.O.:1860; I.V.:2673.2; IV Piggyback:318.8] Out: 4325 [Urine:4300; Blood:25] Intake/Output this shift: No intake/output data recorded.  Recent Labs    02/19/21 0319  HGB 11.8*   Recent Labs    02/19/21 0319  WBC 14.1*  RBC 4.00  HCT 35.4*  PLT 304   Recent Labs    02/19/21 0319  NA 129*  K 4.2  CL 96*  CO2 26  BUN 11  CREATININE 0.82  GLUCOSE 132*  CALCIUM 8.4*   No results for input(s): LABPT, INR in the last 72 hours.  Neurologically intact ABD soft Sensation intact distally Intact pulses distally Dorsiflexion/Plantar flexion intact Incision: dressing C/D/I Compartment soft No DVT  Assessment/Plan:  1 Day Post-Op Procedure(s) (LRB): TOTAL KNEE ARTHROPLASTY (Right) Advance diet Up with therapy D/C IV fluids Plan for discharge tomorrow Na low. D/C IV   Active Problems:   Right knee DJD      Javier Docker 02/19/2021, @[facility] 

## 2021-02-20 DIAGNOSIS — M1711 Unilateral primary osteoarthritis, right knee: Secondary | ICD-10-CM | POA: Diagnosis not present

## 2021-02-20 LAB — CBC
HCT: 36.8 % (ref 36.0–46.0)
Hemoglobin: 12.3 g/dL (ref 12.0–15.0)
MCH: 29 pg (ref 26.0–34.0)
MCHC: 33.4 g/dL (ref 30.0–36.0)
MCV: 86.8 fL (ref 80.0–100.0)
Platelets: 291 10*3/uL (ref 150–400)
RBC: 4.24 MIL/uL (ref 3.87–5.11)
RDW: 13 % (ref 11.5–15.5)
WBC: 9.7 10*3/uL (ref 4.0–10.5)
nRBC: 0 % (ref 0.0–0.2)

## 2021-02-20 MED ORDER — METHOCARBAMOL 500 MG PO TABS: 500.0000 mg | ORAL_TABLET | Freq: Four times a day (QID) | ORAL | 1 refills | Status: DC | PRN

## 2021-02-20 MED ORDER — POLYETHYLENE GLYCOL 3350 17 G PO PACK: 17.0000 g | PACK | Freq: Every day | ORAL | 0 refills | Status: DC

## 2021-02-20 MED ORDER — ASPIRIN 81 MG PO CHEW: 81.0000 mg | CHEWABLE_TABLET | Freq: Two times a day (BID) | ORAL | 1 refills | Status: AC

## 2021-02-20 MED ORDER — HYDROCODONE-ACETAMINOPHEN 5-325 MG PO TABS: 1.0000 | ORAL_TABLET | Freq: Four times a day (QID) | ORAL | 0 refills | Status: DC | PRN

## 2021-02-20 MED ORDER — DOCUSATE SODIUM 100 MG PO CAPS: 100.0000 mg | ORAL_CAPSULE | Freq: Two times a day (BID) | ORAL | 2 refills | Status: DC | PRN

## 2021-02-20 MED ORDER — ASPIRIN 81 MG PO CHEW: 81.0000 mg | CHEWABLE_TABLET | Freq: Two times a day (BID) | ORAL | 1 refills | Status: DC

## 2021-02-20 NOTE — Progress Notes (Signed)
Physical Therapy Treatment Patient Details Name: Yolanda Gordon MRN: [id number] DOB: September 27, 1960 Today's Date: 02/20/2021    History of Present Illness Patient is 60 y.o. female s/p Rt TKA on 02/18/21 with PMH significant for OA, HTN.    PT Comments    Pt progressing well with mobility but continues to fatigue easily - noted HR of 146 on return to bed - RN aware.  Pt performed therex program with written instruction provided, reviewed don/doff KI, ambulated increased distance in hall and negotiated stairs.   Follow Up Recommendations  Follow surgeon's recommendation for DC plan and follow-up therapies;Home health PT     Equipment Recommendations  None recommended by PT    Recommendations for Other Services       Precautions / Restrictions Precautions Precautions: Fall Restrictions Weight Bearing Restrictions: No RLE Weight Bearing: Weight bearing as tolerated    Mobility  Bed Mobility Overal bed mobility: Needs Assistance Bed Mobility: Supine to Sit;Sit to Supine     Supine to sit: Min guard Sit to supine: Min guard   General bed mobility comments: Increased time with pt self assisting R LE with gait belt    Transfers Overall transfer level: Needs assistance Equipment used: Rolling walker (2 wheeled) Transfers: Sit to/from Stand Sit to Stand: Min guard;Supervision         General transfer comment: cues for LE management and use of UEs to self assist  Ambulation/Gait Ambulation/Gait assistance: Min guard;Supervision Gait Distance (Feet): 120 Feet Assistive device: Rolling walker (2 wheeled) Gait Pattern/deviations: Step-to pattern;Decreased stride length;Decreased weight shift to right;Antalgic;Trunk flexed;Shuffle Gait velocity: decr   General Gait Details: cues for posture, position from RW and initial sequence   Stairs Stairs: Yes Stairs assistance: Min assist Stair Management: One rail Left;Two rails;Step to pattern;Forwards;With crutches Number  of Stairs: 4 General stair comments: two steps with bil rails and 2 steps with rail and crutch; cues for sequence and foot/crutch placement   Wheelchair Mobility    Modified Rankin (Stroke Patients Only)       Balance Overall balance assessment: Needs assistance Sitting-balance support: Feet supported Sitting balance-Leahy Scale: Good     Standing balance support: During functional activity;Bilateral upper extremity supported Standing balance-Leahy Scale: Fair                              Cognition Arousal/Alertness: Awake/alert Behavior During Therapy: WFL for tasks assessed/performed;Impulsive Overall Cognitive Status: Within Functional Limits for tasks assessed                                        Exercises Total Joint Exercises Ankle Circles/Pumps: AROM;Both;20 reps;Supine Quad Sets: AROM;Both;10 reps;Supine Heel Slides: AAROM;Right;Supine;20 reps Hip ABduction/ADduction: AAROM;Right;10 reps;Supine Straight Leg Raises: AAROM;Right;Supine;20 reps Goniometric ROM: AAROM -5 - 40    General Comments        Pertinent Vitals/Pain Pain Assessment: 0-10 Pain Score: 5  Pain Location: Rt knee Pain Descriptors / Indicators: Aching;Sore Pain Intervention(s): Limited activity within patient's tolerance;Monitored during session;Premedicated before session;Ice applied    Home Living                      Prior Function            PT Goals (current goals can now be found in the care plan section) Acute Rehab PT Goals Patient Stated  Goal: Regain iND PT Goal Formulation: With patient Time For Goal Achievement: 02/25/21 Potential to Achieve Goals: Good Progress towards PT goals: Progressing toward goals    Frequency    7X/week      PT Plan Current plan remains appropriate    Co-evaluation              AM-PAC PT "6 Clicks" Mobility   Outcome Measure  Help needed turning from your back to your side while in a flat  bed without using bedrails?: A Little Help needed moving from lying on your back to sitting on the side of a flat bed without using bedrails?: A Little Help needed moving to and from a bed to a chair (including a wheelchair)?: A Little Help needed standing up from a chair using your arms (e.g., wheelchair or bedside chair)?: A Little Help needed to walk in hospital room?: A Little Help needed climbing 3-5 steps with a railing? : A Little 6 Click Score: 18    End of Session Equipment Utilized During Treatment: Gait belt;Right knee immobilizer Activity Tolerance: Patient tolerated treatment well Patient left: in bed;with bed alarm set;with call bell/phone within reach;with nursing/sitter in room Nurse Communication: Mobility status PT Visit Diagnosis: Muscle weakness (generalized) (M62.81);Difficulty in walking, not elsewhere classified (R26.2);Pain Pain - Right/Left: Right Pain - part of body: Knee     Time: 4403-4742 PT Time Calculation (min) (ACUTE ONLY): 43 min  Charges:  $Gait Training: 8-22 mins $Therapeutic Exercise: 8-22 mins $Therapeutic Activity: 8-22 mins                     Yolanda Gordon PT Acute Rehabilitation Services Pager [phone] Office [phone]    Yolanda Gordon 02/20/2021, 12:21 PM

## 2021-02-20 NOTE — Progress Notes (Signed)
   02/20/21 0546  Assess: MEWS Score  Temp 98.2 F (36.8 C)  BP (!) 150/79  Pulse Rate (!) 117  Resp 17  SpO2 100 %  O2 Device Room Air  Assess: MEWS Score  MEWS Temp 0  MEWS Systolic 0  MEWS Pulse 2  MEWS RR 0  MEWS LOC 0  MEWS Score 2  MEWS Score Color Yellow  Assess: if the MEWS score is Yellow or Red  Were vital signs taken at a resting state? Yes  Focused Assessment No change from prior assessment  Does the patient meet 2 or more of the SIRS criteria? No  MEWS guidelines implemented *See Row Information* No, previously yellow, continue vital signs every 4 hours  Escalate  MEWS: Escalate Yellow: discuss with charge nurse/RN and consider discussing with provider and RRT  Notify: Charge Nurse/RN  Name of Charge Nurse/RN Notified Linda, RN  Date Charge Nurse/RN Notified 02/20/21  Time Charge Nurse/RN Notified 0546  Assess: SIRS CRITERIA  SIRS Temperature  0  SIRS Pulse 1  SIRS Respirations  0  SIRS WBC 0  SIRS Score Sum  1   No change from previous assessment, pt stable, no distress noted.

## 2021-02-20 NOTE — Progress Notes (Addendum)
   02/20/21 0148  Assess: MEWS Score  Temp 98 F (36.7 C)  BP (!) 144/82  Pulse Rate (!) 134  Resp 18  Level of Consciousness Alert  SpO2 100 %  O2 Device Room Air  Assess: MEWS Score  MEWS Temp 0  MEWS Systolic 0  MEWS Pulse 3  MEWS RR 0  MEWS LOC 0  MEWS Score 3  MEWS Score Color Yellow  Assess: if the MEWS score is Yellow or Red  Were vital signs taken at a resting state? Yes  Focused Assessment No change from prior assessment  Does the patient meet 2 or more of the SIRS criteria? No  MEWS guidelines implemented *See Row Information* Yes  Treat  MEWS Interventions Other (Comment) (Provider notified, continue to monitor)  Pain Scale 0-10  Pain Score 0  Take Vital Signs  Increase Vital Sign Frequency  Yellow: Q 2hr X 2 then Q 4hr X 2, if remains yellow, continue Q 4hrs  Escalate  MEWS: Escalate Yellow: discuss with charge nurse/RN and consider discussing with provider and RRT  Notify: Charge Nurse/RN  Name of Charge Nurse/RN Notified Linda, RN  Date Charge Nurse/RN Notified 02/20/21  Time Charge Nurse/RN Notified 0148  Notify: Provider  Provider Name/Title Nelia Shi, PA  Date Provider Notified 02/20/21  Time Provider Notified 0203  Notification Type Call  Notification Reason Change in status  Provider response No new orders;Other (Comment) (continue to monitor)  Date of Provider Response 02/20/21  Time of Provider Response 0203  Document  Patient Outcome Other (Comment) (Pt remained stable throughout)  Progress note created (see row info) Yes  Assess: SIRS CRITERIA  SIRS Temperature  0  SIRS Pulse 1  SIRS Respirations  0  SIRS WBC 0  SIRS Score Sum  1   Pt heart rate 134 during VS checks. Pt was resting, no distress noted. Done a ekg, shows sinus tach. Reassessed pt, no changes from previous assessment. Notified the CN and notified the on call provider of Dr. Shelle Iron. No new orders. Will continue to monitor the pt.

## 2021-02-20 NOTE — Progress Notes (Addendum)
Subjective: 2 Days Post-Op Procedure(s) (LRB): TOTAL KNEE ARTHROPLASTY (Right) Patient reports pain as mild.   No N/V, numbness, tingling, CP, SOB. Feels ready to go home later today  Objective: Vital signs in last 24 hours: Temp:  [97.7 F (36.5 C)-99.2 F (37.3 C)] 98.2 F (36.8 C) (09/09 0546) Pulse Rate:  [68-134] 117 (09/09 0546) Resp:  [16-18] 17 (09/09 0546) BP: (144-153)/(63-82) 150/79 (09/09 0546) SpO2:  [100 %] 100 % (09/09 0546)  Intake/Output from previous day: 09/08 0701 - 09/09 0700 In: 1193.5 [P.O.:1080; I.V.:113.5] Out: 250 [Urine:250] Intake/Output this shift: No intake/output data recorded.  Recent Labs    02/19/21 0319 02/20/21 0319  HGB 11.8* 12.3   Recent Labs    02/19/21 0319 02/20/21 0319  WBC 14.1* 9.7  RBC 4.00 4.24  HCT 35.4* 36.8  PLT 304 291   Recent Labs    02/19/21 0319  NA 129*  K 4.2  CL 96*  CO2 26  BUN 11  CREATININE 0.82  GLUCOSE 132*  CALCIUM 8.4*   No results for input(s): LABPT, INR in the last 72 hours.  Neurologically intact ABD soft Neurovascular intact Sensation intact distally Intact pulses distally Dorsiflexion/Plantar flexion intact Incision: dressing C/D/I and no drainage No cellulitis present Compartment soft No sign of DVT   Assessment/Plan: 2 Days Post-Op Procedure(s) (LRB): TOTAL KNEE ARTHROPLASTY (Right) Advance diet Up with therapy D/C IV fluids D/C home with HHPT later today - ALREADY ARRANGED WITH [facility] OFFICE Discussed with Dr Tenna Delaine Doralee Albino 02/20/2021, 8:24 AM

## 2021-02-20 NOTE — Progress Notes (Signed)
Provided discharge education/instructions, all questions and concerns addressed, Pt complains of mild pain 4/10, PRN pain med given, denies chest pain or SOB, no palpitations. Pt states she feels okay and ready to go home. Discharged home with belongings accompanied by husband.

## 2021-02-20 NOTE — Progress Notes (Signed)
   02/20/21 0348  Assess: MEWS Score  Temp 99.2 F (37.3 C)  BP (!) 149/79  Pulse Rate (!) 113  Resp 17  SpO2 100 %  O2 Device Room Air  Assess: MEWS Score  MEWS Temp 0  MEWS Systolic 0  MEWS Pulse 2  MEWS RR 0  MEWS LOC 0  MEWS Score 2  MEWS Score Color Yellow  Assess: if the MEWS score is Yellow or Red  Were vital signs taken at a resting state? Yes  Focused Assessment No change from prior assessment  Does the patient meet 2 or more of the SIRS criteria? No  MEWS guidelines implemented *See Row Information* No, previously yellow, continue vital signs every 4 hours  Treat  Pain Scale 0-10  Pain Score 0  Escalate  MEWS: Escalate Yellow: discuss with charge nurse/RN and consider discussing with provider and RRT  Notify: Charge Nurse/RN  Name of Charge Nurse/RN Notified Linda, RN  Date Charge Nurse/RN Notified 02/20/21  Time Charge Nurse/RN Notified 0348  Assess: SIRS CRITERIA  SIRS Temperature  0  SIRS Pulse 1  SIRS Respirations  0  SIRS WBC 0  SIRS Score Sum  1   No change from previous assessment, pt remains stable, no distress noted.

## 2021-02-20 NOTE — Plan of Care (Signed)
  Problem: Activity: Goal: Ability to avoid complications of mobility impairment will improve Outcome: Progressing   Problem: Clinical Measurements: Goal: Postoperative complications will be avoided or minimized Outcome: Progressing   Problem: Pain Management: Goal: Pain level will decrease with appropriate interventions Outcome: Progressing   Problem: Skin Integrity: Goal: Will show signs of wound healing Outcome: Progressing   

## 2021-02-28 NOTE — Discharge Summary (Signed)
Physician Discharge Summary   Patient ID: Yolanda Gordon MRN: [id number] DOB/AGE: 18-Dec-1960 60 y.o.  Admit date: 02/18/2021 Discharge date: 02/20/2021  Primary Diagnosis: right knee primary osteoarthritis  Admission Diagnoses:  Past Medical History:  Diagnosis Date   Arthritis    Hypertension    Discharge Diagnoses:   Active Problems:   Right knee DJD  Estimated body mass index is 28.35 kg/m as calculated from the following:   Height as of this encounter: 5\' 2"  (1.575 m).   Weight as of this encounter: 70.3 kg.  Procedure:  Procedure(s) (LRB): TOTAL KNEE ARTHROPLASTY (Right)   Consults: None  HPI: see H&P Laboratory Data: Admission on 02/18/2021, Discharged on 02/20/2021  Component Date Value Ref Range Status   WBC 02/19/2021 14.1 (A) 4.0 - 10.5 K/uL Final   RBC 02/19/2021 4.00  3.87 - 5.11 MIL/uL Final   Hemoglobin 02/19/2021 11.8 (A) 12.0 - 15.0 g/dL Final   HCT 04/21/2021 35.4 (A) 36.0 - 46.0 % Final   MCV 02/19/2021 88.5  80.0 - 100.0 fL Final   MCH 02/19/2021 29.5  26.0 - 34.0 pg Final   MCHC 02/19/2021 33.3  30.0 - 36.0 g/dL Final   RDW 04/21/2021 13.0  11.5 - 15.5 % Final   Platelets 02/19/2021 304  150 - 400 K/uL Final   nRBC 02/19/2021 0.0  0.0 - 0.2 % Final   Performed at [facility], [address], [place], [place]   Sodium 02/19/2021 129 (A) 135 - 145 mmol/L Final   Potassium 02/19/2021 4.2  3.5 - 5.1 mmol/L Final   Chloride 02/19/2021 96 (A) 98 - 111 mmol/L Final   CO2 02/19/2021 26  22 - 32 mmol/L Final   Glucose, Bld 02/19/2021 132 (A) 70 - 99 mg/dL Final   Glucose reference range applies only to samples taken after fasting for at least 8 hours.   BUN 02/19/2021 11  6 - 20 mg/dL Final   Creatinine, Ser 02/19/2021 0.82  0.44 - 1.00 mg/dL Final   Calcium 04/21/2021 8.4 (A) 8.9 - 10.3 mg/dL Final   GFR, Estimated 02/19/2021 >60  >60 mL/min Final   Comment: (NOTE) Calculated using the CKD-EPI Creatinine Equation  (2021)    Anion gap 02/19/2021 7  5 - 15 Final   Performed at [facility], [address] [place], [place]   WBC 02/20/2021 9.7  4.0 - 10.5 K/uL Final   RBC 02/20/2021 4.24  3.87 - 5.11 MIL/uL Final   Hemoglobin 02/20/2021 12.3  12.0 - 15.0 g/dL Final   HCT 04/22/2021 36.8  36.0 - 46.0 % Final   MCV 02/20/2021 86.8  80.0 - 100.0 fL Final   MCH 02/20/2021 29.0  26.0 - 34.0 pg Final   MCHC 02/20/2021 33.4  30.0 - 36.0 g/dL Final   RDW 04/22/2021 13.0  11.5 - 15.5 % Final   Platelets 02/20/2021 291  150 - 400 K/uL Final   nRBC 02/20/2021 0.0  0.0 - 0.2 % Final   Performed at [facility], [address], [place], [place]  Orders Only on 02/17/2021  Component Date Value Ref Range Status   SARS Coronavirus 2 02/17/2021 RESULT: NEGATIVE   Final   Comment: RESULT: NEGATIVESARS-CoV-2 INTERPRETATION:A NEGATIVE  test result means that SARS-CoV-2 RNA was not present in the specimen above the limit of detection of this test. This does not preclude a possible SARS-CoV-2 infection and should not be used as the  sole  basis for patient management decisions. Negative results must be combined with clinical observations, patient history, and epidemiological information. Optimum specimen types and timing for peak viral levels during infections caused by SARS-CoV-2  have not been determined. Collection of multiple specimens or types of specimens may be necessary to detect virus. Improper specimen collection and handling, sequence variability under primers/probes, or organism present below the limit of detection may  lead to false negative results. Positive and negative predictive values of testing are highly dependent on prevalence. False negative test results are more likely when prevalence of disease is high.The expected result is NEGATIVE.Fact S                          heet for  Healthcare Providers: [employer] Sheet  for Patients: https://poole-freeman.org/ Reference Range - Negative   Hospital Outpatient Visit on 02/10/2021  Component Date Value Ref Range Status   MRSA, PCR 02/10/2021 NEGATIVE  NEGATIVE Final   Staphylococcus aureus 02/10/2021 NEGATIVE  NEGATIVE Final   Comment: (NOTE) The Xpert SA Assay (FDA approved for NASAL specimens in patients 85 years of age and older), is one component of a comprehensive surveillance program. It is not intended to diagnose infection nor to guide or monitor treatment. Performed at [facility], [address] [place], [place] [address]    WBC 02/10/2021 5.8  4.0 - 10.5 K/uL Final   RBC 02/10/2021 4.29  3.87 - 5.11 MIL/uL Final   Hemoglobin 02/10/2021 12.4  12.0 - 15.0 g/dL Final   HCT 10/62/6948 39.0  36.0 - 46.0 % Final   MCV 02/10/2021 90.9  80.0 - 100.0 fL Final   MCH 02/10/2021 28.9  26.0 - 34.0 pg Final   MCHC 02/10/2021 31.8  30.0 - 36.0 g/dL Final   RDW 54/62/7035 13.0  11.5 - 15.5 % Final   Platelets 02/10/2021 307  150 - 400 K/uL Final   nRBC 02/10/2021 0.0  0.0 - 0.2 % Final   Performed at [facility], [address] [place], [place] [address]   Sodium 02/10/2021 141  135 - 145 mmol/L Final   Potassium 02/10/2021 4.3  3.5 - 5.1 mmol/L Final   Chloride 02/10/2021 108  98 - 111 mmol/L Final   CO2 02/10/2021 28  22 - 32 mmol/L Final   Glucose, Bld 02/10/2021 102 (A) 70 - 99 mg/dL Final   Glucose reference range applies only to samples taken after fasting for at least 8 hours.   BUN 02/10/2021 12  6 - 20 mg/dL Final   Creatinine, Ser 02/10/2021 0.73  0.44 - 1.00 mg/dL Final   Calcium 18/29/9371 9.2  8.9 - 10.3 mg/dL Final   GFR, Estimated 02/10/2021 >60  >60 mL/min Final   Comment: (NOTE) Calculated using the CKD-EPI Creatinine Equation (2021)    Anion gap 02/10/2021 5  5 - 15 Final   Performed at [facility], [address] [place], [place] [address]      X-Rays:DG Knee 1-2 Views Right  Result Date: 02/18/2021 CLINICAL DATA:  Knee replacement EXAM: RIGHT KNEE - 1-2 VIEW COMPARISON:  None. FINDINGS: Two views study shows tricompartmental knee replacement. No evidence for immediate hardware complication. Gas in the soft tissues and joint space is compatible with the immediate postoperative state. IMPRESSION: Status post tricompartmental knee replacement without evidence for immediate hardware complication. Electronically Signed   By: Kennith Center M.D.   On: 02/18/2021 12:53    EKG:  Orders placed or performed during the hospital encounter of 02/18/21   EKG 12-Lead   EKG 12-Lead   EKG 12-Lead   EKG 12-Lead   EKG     Hospital Course: Yolanda Gordon is a 60 y.o. who was admitted to [facility]. They were brought to the operating room on 02/18/2021 and underwent Procedure(s): TOTAL KNEE ARTHROPLASTY.  Patient tolerated the procedure well and was later transferred to the recovery room and then to the orthopaedic floor for postoperative care.  They were given PO and IV analgesics for pain control following their surgery.  They were given 24 hours of postoperative antibiotics of  Anti-infectives (From admission, onward)    Start     Dose/Rate Route Frequency Ordered Stop   02/18/21 1430  ceFAZolin (ANCEF) IVPB 2g/100 mL premix        2 g 200 mL/hr over 30 Minutes Intravenous Every 6 hours 02/18/21 1242 02/18/21 2052   02/18/21 0615  ceFAZolin (ANCEF) IVPB 2g/100 mL premix        2 g 200 mL/hr over 30 Minutes Intravenous On call to O.R. 02/18/21 2458 02/18/21 0998      and started on DVT prophylaxis in the form of Aspirin, TED hose, and SCDs .   PT and OT were ordered for total joint protocol.  Discharge planning consulted to help with postop disposition and equipment needs.  Patient had a fair night on the evening of surgery.  They started to get up OOB with therapy on day one. Continued to work with therapy into day two.    By day  two, the patient had progressed with therapy and meeting their goals.  Incision was healing well.  Patient was seen in rounds and was ready to go home.   Diet: Regular diet Activity:WBAT Follow-up:in 10-14 days Disposition - Home with HHPT Genevieve Norlander Discharged Condition: good   Discharge Instructions     Call MD / Call 911   Complete by: As directed    If you experience chest pain or shortness of breath, CALL 911 and be transported to the hospital emergency room.  If you develope a fever above 101 F, pus (white drainage) or increased drainage or redness at the wound, or calf pain, call your surgeon's office.   Constipation Prevention   Complete by: As directed    Drink plenty of fluids.  Prune juice may be helpful.  You may use a stool softener, such as Colace (over the counter) 100 mg twice a day.  Use MiraLax (over the counter) for constipation as needed.   Diet - low sodium heart healthy   Complete by: As directed    Increase activity slowly as tolerated   Complete by: As directed    Post-operative opioid taper instructions:   Complete by: As directed    POST-OPERATIVE OPIOID TAPER INSTRUCTIONS: It is important to wean off of your opioid medication as soon as possible. If you do not need pain medication after your surgery it is ok to stop day one. Opioids include: Codeine, Hydrocodone(Norco, Vicodin), Oxycodone(Percocet, oxycontin) and hydromorphone amongst others.  Long term and even short term use of opiods can cause: Increased pain response Dependence Constipation Depression Respiratory depression And more.  Withdrawal symptoms can include Flu like symptoms Nausea, vomiting And more Techniques to manage these symptoms Hydrate well Eat regular healthy meals Stay active Use relaxation techniques(deep breathing, meditating, yoga) Do Not substitute Alcohol to help with tapering If you have been on opioids for  less than two weeks and do not have pain than it is ok to  stop all together.  Plan to wean off of opioids This plan should start within one week post op of your joint replacement. Maintain the same interval or time between taking each dose and first decrease the dose.  Cut the total daily intake of opioids by one tablet each day Next start to increase the time between doses. The last dose that should be eliminated is the evening dose.         Allergies as of 02/20/2021   No Known Allergies      Medication List     STOP taking these medications    oxybutynin 5 MG 24 hr tablet Commonly known as: DITROPAN-XL       TAKE these medications    acetaminophen 500 MG tablet Commonly known as: TYLENOL Take 500-1,000 mg by mouth every 6 (six) hours as needed for moderate pain. Notes to patient: Last dose given 09/09 05:42am   aspirin 81 MG chewable tablet Chew 1 tablet (81 mg total) by mouth 2 (two) times daily for 20 days. Followed by 1 tablet (81 mg total) by mouth daily thereafter.   docusate sodium 100 MG capsule Commonly known as: Colace Take 1 capsule (100 mg total) by mouth 2 (two) times daily as needed. Notes to patient: Last dose given 09/09 08:09am   HYDROcodone-acetaminophen 5-325 MG tablet Commonly known as: NORCO/VICODIN Take 1-2 tablets by mouth every 6 (six) hours as needed for moderate pain (pain score 4-6).   methocarbamol 500 MG tablet Commonly known as: ROBAXIN Take 1 tablet (500 mg total) by mouth every 6 (six) hours as needed for muscle spasms.   polyethylene glycol 17 g packet Commonly known as: MIRALAX / GLYCOLAX Take 17 g by mouth daily.        Follow-up Information     Jene Every, MD Follow up in 2 week(s).   Specialty: Orthopedic Surgery Contact information: [address] [place] [address] [phone]         [facility] Follow up.   Specialty: Home Health Services Why: PT Contact information: [address] [place]  [address] [phone]                 Signed: Andrez Grime PA-C Orthopaedic Surgery 02/28/2021, 9:12 AM

## 2021-06-25 ENCOUNTER — Other Ambulatory Visit: Payer: Self-pay

## 2021-06-30 ENCOUNTER — Other Ambulatory Visit: Payer: Self-pay | Admitting: Nurse Practitioner

## 2021-06-30 ENCOUNTER — Other Ambulatory Visit: Payer: Self-pay

## 2021-06-30 DIAGNOSIS — Z1231 Encounter for screening mammogram for malignant neoplasm of breast: Secondary | ICD-10-CM

## 2021-08-05 ENCOUNTER — Other Ambulatory Visit: Payer: Self-pay

## 2021-08-05 ENCOUNTER — Ambulatory Visit
Admission: RE | Admit: 2021-08-05 | Discharge: 2021-08-05 | Disposition: A | Discharge status: Home / Self Care | Payer: 59 | Source: Ambulatory Visit | Attending: Nurse Practitioner | Admitting: Nurse Practitioner

## 2021-08-05 DIAGNOSIS — Z1231 Encounter for screening mammogram for malignant neoplasm of breast: Secondary | ICD-10-CM

## 2022-01-08 ENCOUNTER — Ambulatory Visit (INDEPENDENT_AMBULATORY_CARE_PROVIDER_SITE_OTHER): Payer: 59 | Admitting: Nurse Practitioner

## 2022-01-08 ENCOUNTER — Encounter: Payer: Self-pay | Admitting: Nurse Practitioner

## 2022-01-08 VITALS — BP 129/76 | HR 66 | Temp 97.9°F | Resp 20 | Ht 62.0 in | Wt 146.0 lb

## 2022-01-08 DIAGNOSIS — F5101 Primary insomnia: Secondary | ICD-10-CM

## 2022-01-08 MED ORDER — TRAZODONE HCL 50 MG PO TABS
25.0000 mg | ORAL_TABLET | Freq: Every evening | ORAL | 3 refills | Status: DC | PRN
Start: 2022-01-08 — End: 2022-12-29

## 2022-01-08 NOTE — Patient Instructions (Signed)
Insomnia Insomnia is a sleep disorder that makes it difficult to fall asleep or stay asleep. Insomnia can cause fatigue, low energy, difficulty concentrating, mood swings, and poor performance at work or school. There are three different ways to classify insomnia: Difficulty falling asleep. Difficulty staying asleep. Waking up too early in the morning. Any type of insomnia can be long-term (chronic) or short-term (acute). Both are common. Short-term insomnia usually lasts for 3 months or less. Chronic insomnia occurs at least three times a week for longer than 3 months. What are the causes? Insomnia may be caused by another condition, situation, or substance, such as: Having certain mental health conditions, such as anxiety and depression. Using caffeine, alcohol, tobacco, or drugs. Having gastrointestinal conditions, such as gastroesophageal reflux disease (GERD). Having certain medical conditions. These include: Asthma. Alzheimer's disease. Stroke. Chronic pain. An overactive thyroid gland (hyperthyroidism). Other sleep disorders, such as restless legs syndrome and sleep apnea. Menopause. Sometimes, the cause of insomnia may not be known. What increases the risk? Risk factors for insomnia include: Gender. Females are affected more often than males. Age. Insomnia is more common as people get older. Stress and certain medical and mental health conditions. Lack of exercise. Having an irregular work schedule. This may include working night shifts and traveling between different time zones. What are the signs or symptoms? If you have insomnia, the main symptom is having trouble falling asleep or having trouble staying asleep. This may lead to other symptoms, such as: Feeling tired or having low energy. Feeling nervous about going to sleep. Not feeling rested in the morning. Having trouble concentrating. Feeling irritable, anxious, or depressed. How is this diagnosed? This condition  may be diagnosed based on: Your symptoms and medical history. Your health care provider may ask about: Your sleep habits. Any medical conditions you have. Your mental health. A physical exam. How is this treated? Treatment for insomnia depends on the cause. Treatment may focus on treating an underlying condition that is causing the insomnia. Treatment may also include: Medicines to help you sleep. Counseling or therapy. Lifestyle adjustments to help you sleep better. Follow these instructions at home: Eating and drinking  Limit or avoid alcohol, caffeinated beverages, and products that contain nicotine and tobacco, especially close to bedtime. These can disrupt your sleep. Do not eat a large meal or eat spicy foods right before bedtime. This can lead to digestive discomfort that can make it hard for you to sleep. Sleep habits  Keep a sleep diary to help you and your health care provider figure out what could be causing your insomnia. Write down: When you sleep. When you wake up during the night. How well you sleep and how rested you feel the next day. Any side effects of medicines you are taking. What you eat and drink. Make your bedroom a dark, comfortable place where it is easy to fall asleep. Put up shades or blackout curtains to block light from outside. Use a white noise machine to block noise. Keep the temperature cool. Limit screen use before bedtime. This includes: Not watching TV. Not using your smartphone, tablet, or computer. Stick to a routine that includes going to bed and waking up at the same times every day and night. This can help you fall asleep faster. Consider making a quiet activity, such as reading, part of your nighttime routine. Try to avoid taking naps during the day so that you sleep better at night. Get out of bed if you are still awake after   15 minutes of trying to sleep. Keep the lights down, but try reading or doing a quiet activity. When you feel  sleepy, go back to bed. General instructions Take over-the-counter and prescription medicines only as told by your health care provider. Exercise regularly as told by your health care provider. However, avoid exercising in the hours right before bedtime. Use relaxation techniques to manage stress. Ask your health care provider to suggest some techniques that may work well for you. These may include: Breathing exercises. Routines to release muscle tension. Visualizing peaceful scenes. Make sure that you drive carefully. Do not drive if you feel very sleepy. Keep all follow-up visits. This is important. Contact a health care provider if: You are tired throughout the day. You have trouble in your daily routine due to sleepiness. You continue to have sleep problems, or your sleep problems get worse. Get help right away if: You have thoughts about hurting yourself or someone else. Get help right away if you feel like you may hurt yourself or others, or have thoughts about taking your own life. Go to your nearest emergency room or: Call 911. Call the National Suicide Prevention Lifeline at 1-800-273-8255 or 988. This is open 24 hours a day. Text the Crisis Text Line at 741741. Summary Insomnia is a sleep disorder that makes it difficult to fall asleep or stay asleep. Insomnia can be long-term (chronic) or short-term (acute). Treatment for insomnia depends on the cause. Treatment may focus on treating an underlying condition that is causing the insomnia. Keep a sleep diary to help you and your health care provider figure out what could be causing your insomnia. This information is not intended to replace advice given to you by your health care provider. Make sure you discuss any questions you have with your health care provider. Document Revised: 05/11/2021 Document Reviewed: 05/11/2021 Elsevier Patient Education  2023 Elsevier Inc.  

## 2022-01-08 NOTE — Progress Notes (Signed)
Subjective:    Patient ID: Yolanda Gordon, female    DOB: October 09, 1960, 61 y.o.   MRN: [id number]   Chief Complaint: Not sleeping and Stress   HPI  Patient is in c/o fatigue. Says she does not sleep well. Says she has trouble falling asleep. Says she sleeps about 5-6 hours a night. She also wakes up frequently. She has never taken anything for sleep. Denies snoring.    Review of Systems  Constitutional:  Negative for diaphoresis.  Eyes:  Negative for pain.  Respiratory:  Negative for shortness of breath.   Cardiovascular:  Negative for chest pain, palpitations and leg swelling.  Gastrointestinal:  Negative for abdominal pain.  Endocrine: Negative for polydipsia.  Skin:  Negative for rash.  Neurological:  Negative for dizziness, weakness and headaches.  Hematological:  Does not bruise/bleed easily.  All other systems reviewed and are negative.      Objective:   Physical Exam Vitals and nursing note reviewed.  Constitutional:      General: She is not in acute distress.    Appearance: Normal appearance. She is well-developed.  Neck:     Vascular: No carotid bruit or JVD.  Cardiovascular:     Rate and Rhythm: Normal rate and regular rhythm.     Heart sounds: Normal heart sounds.  Pulmonary:     Effort: Pulmonary effort is normal. No respiratory distress.     Breath sounds: Normal breath sounds. No wheezing or rales.  Chest:     Chest wall: No tenderness.  Abdominal:     General: Bowel sounds are normal. There is no distension or abdominal bruit.     Palpations: Abdomen is soft. There is no hepatomegaly, splenomegaly, mass or pulsatile mass.     Tenderness: There is no abdominal tenderness.  Musculoskeletal:        General: Normal range of motion.     Cervical back: Normal range of motion and neck supple.  Lymphadenopathy:     Cervical: No cervical adenopathy.  Skin:    General: Skin is warm and dry.  Neurological:     Mental Status: She is alert and oriented  to person, place, and time.     Deep Tendon Reflexes: Reflexes are normal and symmetric.  Psychiatric:        Behavior: Behavior normal.        Thought Content: Thought content normal.        Judgment: Judgment normal.    BP 129/76   Pulse 66   Temp 97.9 F (36.6 C) (Temporal)   Resp 20   Ht 5\' 2"  (1.575 m)   Wt 146 lb (66.2 kg)   SpO2 100%   BMI 26.70 kg/m         Assessment & Plan:   in today with chief complaint of Not sleeping and Stress   1. Primary insomnia Bedtime routine. Read at night No electronic 1 hour prior to bed - traZODone (DESYREL) 50 MG tablet; Take 0.5-1 tablets (25-50 mg total) by mouth at bedtime as needed for sleep.  Dispense: 30 tablet; Refill: 3    The above assessment and management plan was discussed with the patient. The patient verbalized understanding of and has agreed to the management plan. Patient is aware to call the clinic if symptoms persist or worsen. Patient is aware when to return to the clinic for a follow-up visit. Patient educated on when it is appropriate to go to the emergency department.  Mary-Margaret Winter Jocelyn, FNP   

## 2022-04-14 ENCOUNTER — Ambulatory Visit: Payer: 59 | Admitting: Family Medicine

## 2022-04-30 ENCOUNTER — Telehealth: Payer: Self-pay | Admitting: Nurse Practitioner

## 2022-04-30 ENCOUNTER — Ambulatory Visit (INDEPENDENT_AMBULATORY_CARE_PROVIDER_SITE_OTHER): Payer: 59

## 2022-04-30 ENCOUNTER — Ambulatory Visit (INDEPENDENT_AMBULATORY_CARE_PROVIDER_SITE_OTHER): Payer: 59 | Admitting: Family Medicine

## 2022-04-30 ENCOUNTER — Encounter: Payer: Self-pay | Admitting: Family Medicine

## 2022-04-30 VITALS — BP 124/75 | HR 61 | Temp 97.8°F | Ht 62.0 in | Wt 148.0 lb

## 2022-04-30 DIAGNOSIS — M25511 Pain in right shoulder: Secondary | ICD-10-CM

## 2022-04-30 MED ORDER — MELOXICAM 15 MG PO TABS: 15.0000 mg | ORAL_TABLET | Freq: Every day | ORAL | 0 refills | Status: DC

## 2022-04-30 NOTE — Patient Instructions (Signed)

## 2022-04-30 NOTE — Progress Notes (Signed)
Acute Office Visit  Subjective:     Patient ID: Yolanda Gordon, female    DOB: 08-25-1960, 61 y.o.   MRN: [id number]  Chief Complaint  Patient presents with   Shoulder Pain    Right sided shoulder pain- 2 weeks     Shoulder Pain  The pain is present in the right shoulder. This is a new problem. Episode onset: 2 weeks. There has been no history of extremity trauma. The problem occurs constantly. The problem has been gradually worsening. The quality of the pain is described as aching and burning. Pain severity now: mild to moderate. Associated symptoms include tingling (right fingers). Pertinent negatives include no joint locking, joint swelling, limited range of motion, numbness or stiffness. The symptoms are aggravated by activity. She has tried acetaminophen, NSAIDS, cold, heat and OTC ointments for the symptoms. The treatment provided mild relief. Her past medical history is significant for osteoarthritis.   She has increased pain with overhead lifting. She often lifts heavy objects at work.    She has been to [facility] in past for knee OA.   Review of Systems  Musculoskeletal:  Negative for stiffness.  Neurological:  Positive for tingling (right fingers). Negative for numbness.        Objective:    BP 124/75   Pulse 61   Temp 97.8 F (36.6 C)   Ht 5\' 2"  (1.575 m)   Wt 148 lb (67.1 kg)   SpO2 100%   BMI 27.07 kg/m    Physical Exam Vitals and nursing note reviewed.  Constitutional:      General: She is not in acute distress.    Appearance: She is not ill-appearing, toxic-appearing or diaphoretic.  Pulmonary:     Effort: Pulmonary effort is normal. No respiratory distress.  Musculoskeletal:     Right shoulder: No swelling, deformity, effusion, tenderness, bony tenderness or crepitus. Normal range of motion. Decreased strength.     Right upper arm: Normal.     Right wrist: Normal.     Right hand: Normal.     Cervical back: Normal range of motion. No  edema, erythema, signs of trauma, rigidity, torticollis or crepitus. No pain with movement, spinous process tenderness or muscular tenderness. Normal range of motion.     Right lower leg: No edema.     Left lower leg: No edema.     Comments: Negative empty can test  Skin:    General: Skin is warm and dry.  Neurological:     General: No focal deficit present.     Mental Status: She is alert and oriented to person, place, and time.  Psychiatric:        Mood and Affect: Mood normal.        Behavior: Behavior normal.     No results found for any visits on 04/30/22.      Assessment & Plan:   Lavaeh was seen today for shoulder pain.  Diagnoses and all orders for this visit:  Acute pain of right shoulder Xray today in office, radiology report pending. Will notify patient of results when report available and any changes to plan of care. Mobic daily as below. Do not take other NSAIDs with this. Discussed RICE therapy. Return to office for new or worsening symptoms, or if symptoms persist.  -     DG Shoulder Right; Future -     meloxicam (MOBIC) 15 MG tablet; Take 1 tablet (15 mg total) by mouth daily.  The patient indicates understanding  of these issues and agrees with the plan.  Gwenlyn Perking, FNP

## 2022-04-30 NOTE — Telephone Encounter (Signed)
Pt aware work excuse up front to be picked up.

## 2022-05-03 ENCOUNTER — Other Ambulatory Visit: Payer: Self-pay | Admitting: *Deleted

## 2022-05-03 DIAGNOSIS — M25511 Pain in right shoulder: Secondary | ICD-10-CM

## 2022-05-03 NOTE — Progress Notes (Signed)
Patient calling to discuss results. Please call back.

## 2022-09-14 ENCOUNTER — Other Ambulatory Visit: Payer: Self-pay | Admitting: Specialist

## 2022-09-14 DIAGNOSIS — M25511 Pain in right shoulder: Secondary | ICD-10-CM

## 2022-10-06 ENCOUNTER — Other Ambulatory Visit: Payer: 59

## 2022-10-06 ENCOUNTER — Other Ambulatory Visit: Payer: BC Managed Care – PPO

## 2022-10-06 ENCOUNTER — Inpatient Hospital Stay: Admission: RE | Admit: 2022-10-06 | Payer: 59 | Source: Ambulatory Visit

## 2022-10-21 ENCOUNTER — Inpatient Hospital Stay: Admission: RE | Admit: 2022-10-21 | Payer: BC Managed Care – PPO | Source: Ambulatory Visit

## 2022-11-09 ENCOUNTER — Inpatient Hospital Stay: Admission: RE | Admit: 2022-11-09 | Payer: BC Managed Care – PPO | Source: Ambulatory Visit

## 2022-11-09 ENCOUNTER — Other Ambulatory Visit: Payer: BC Managed Care – PPO

## 2022-11-29 ENCOUNTER — Ambulatory Visit
Admission: RE | Admit: 2022-11-29 | Discharge: 2022-11-29 | Disposition: A | Discharge status: Home / Self Care | Payer: BC Managed Care – PPO | Source: Ambulatory Visit | Attending: Specialist | Admitting: Specialist

## 2022-11-29 DIAGNOSIS — M25511 Pain in right shoulder: Secondary | ICD-10-CM

## 2022-11-29 MED ORDER — IOPAMIDOL (ISOVUE-M 200) INJECTION 41%
15.0000 mL | Freq: Once | INTRAMUSCULAR | Status: AC
  Administered 2022-11-29: 15 mL via INTRA_ARTICULAR

## 2022-11-30 ENCOUNTER — Encounter: Payer: Self-pay | Admitting: Nurse Practitioner

## 2022-12-10 IMAGING — MG MM DIGITAL SCREENING BILAT W/ TOMO AND CAD
6 of 10 series · 6 of 30 positions shown · non-contrast
Comparison: None.

CLINICAL DATA: Screening.

EXAM:
DIGITAL SCREENING BILATERAL MAMMOGRAM WITH TOMOSYNTHESIS AND CAD
TECHNIQUE: Bilateral screening digital craniocaudal and mediolateral oblique
mammograms were obtained. Bilateral screening digital breast
tomosynthesis was performed. The images were evaluated with
computer-aided detection.

[R CC synth-2D]
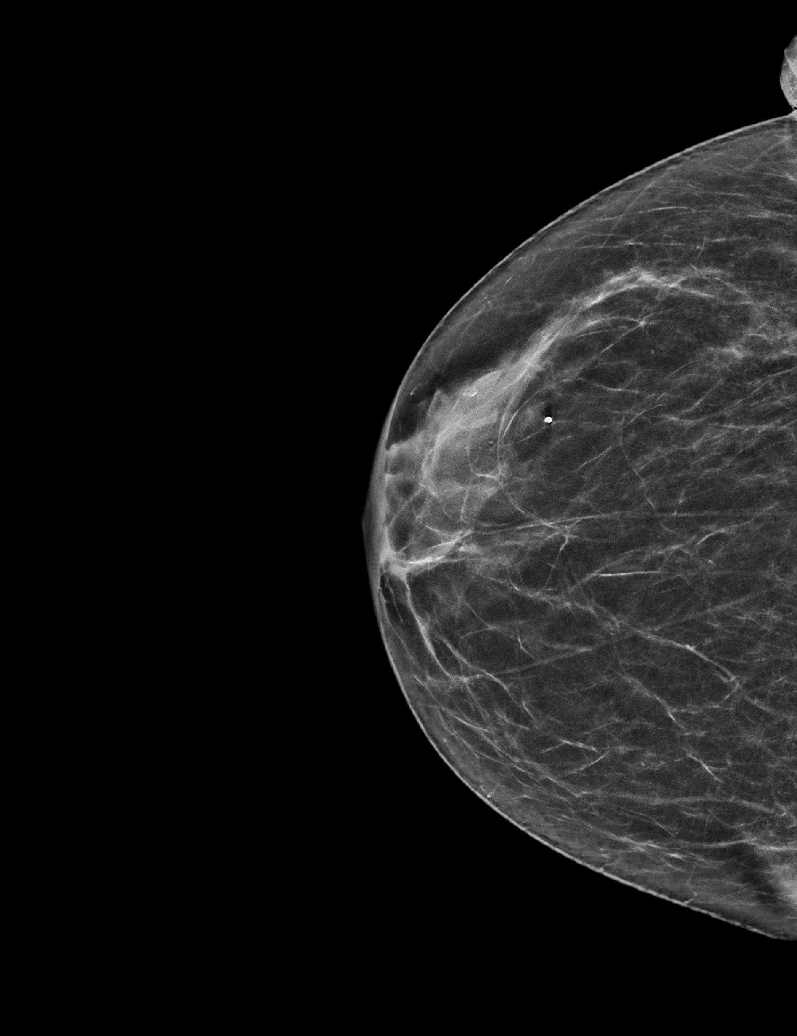

[L MLO synth-2D (1 of 2)]
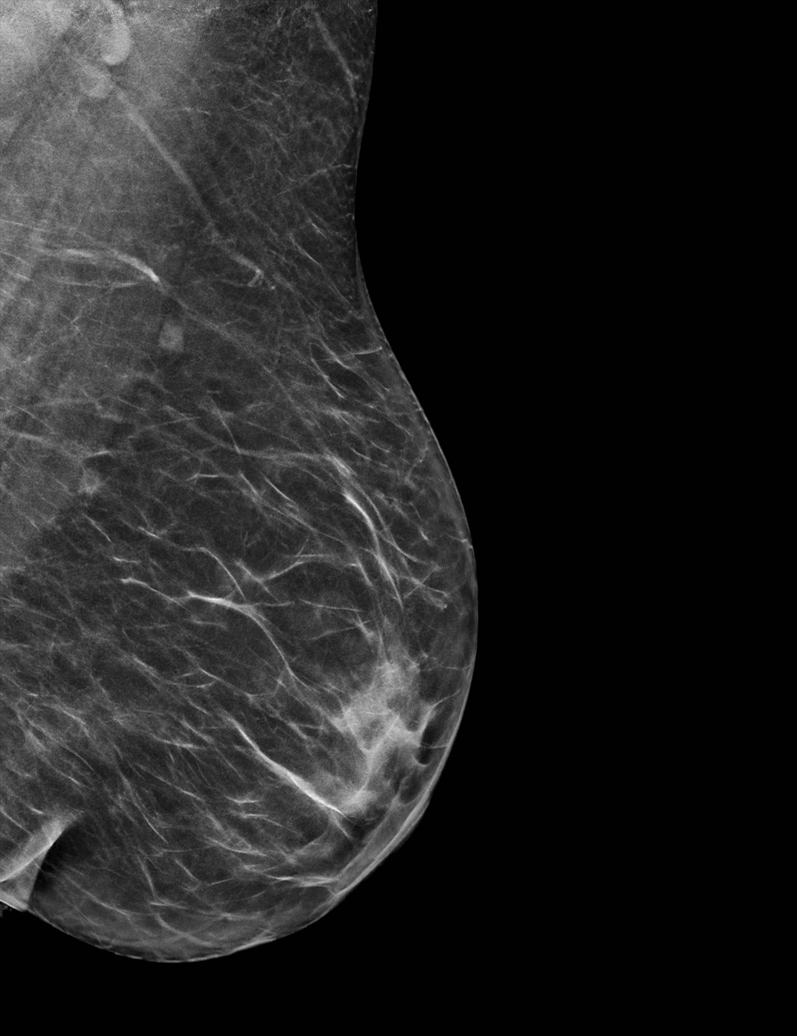

[R MLO synth-2D]
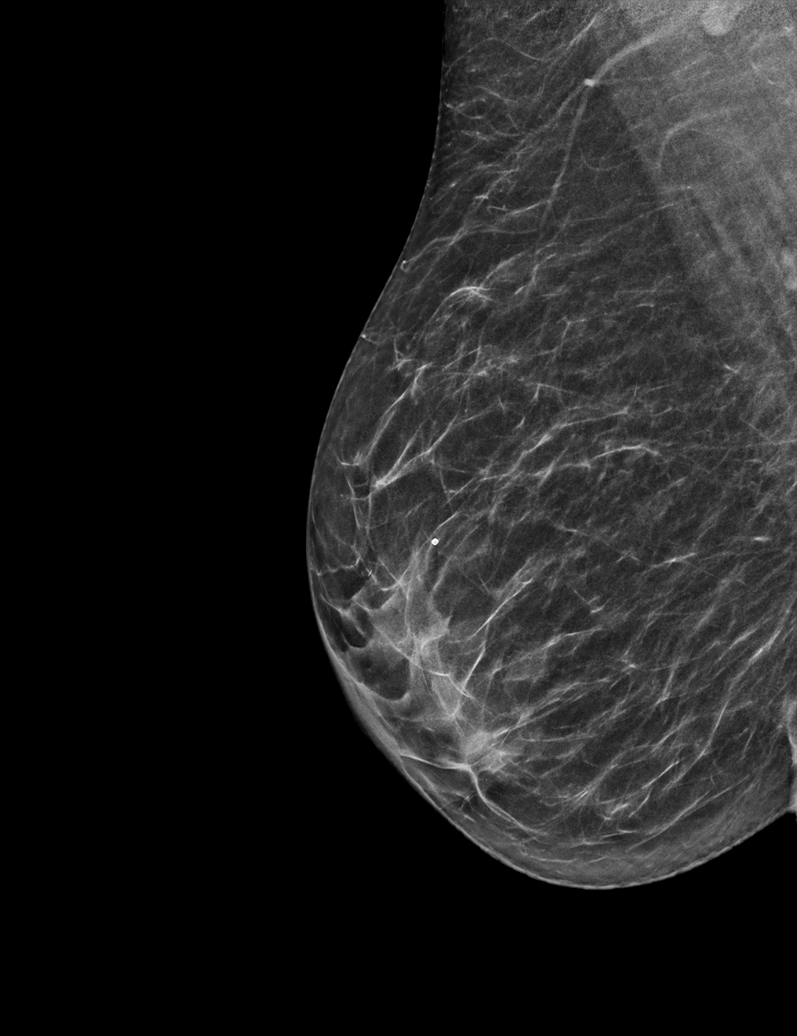

[L MLO synth-2D (2 of 2)]
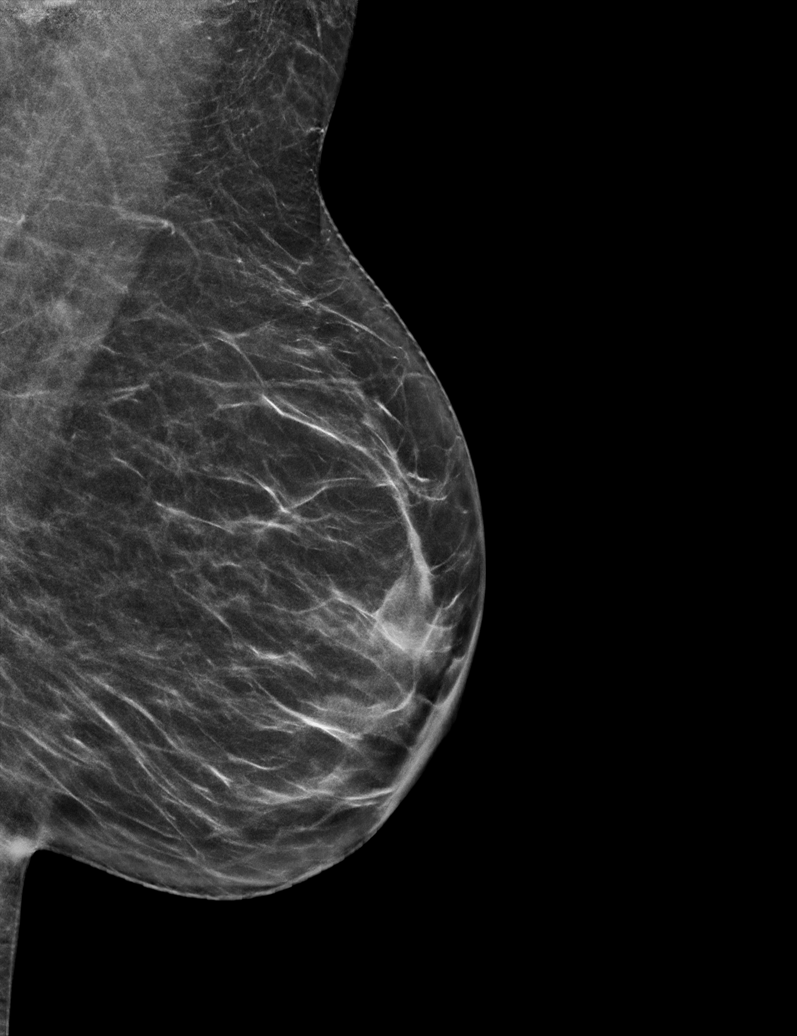

[L CC synth-2D]
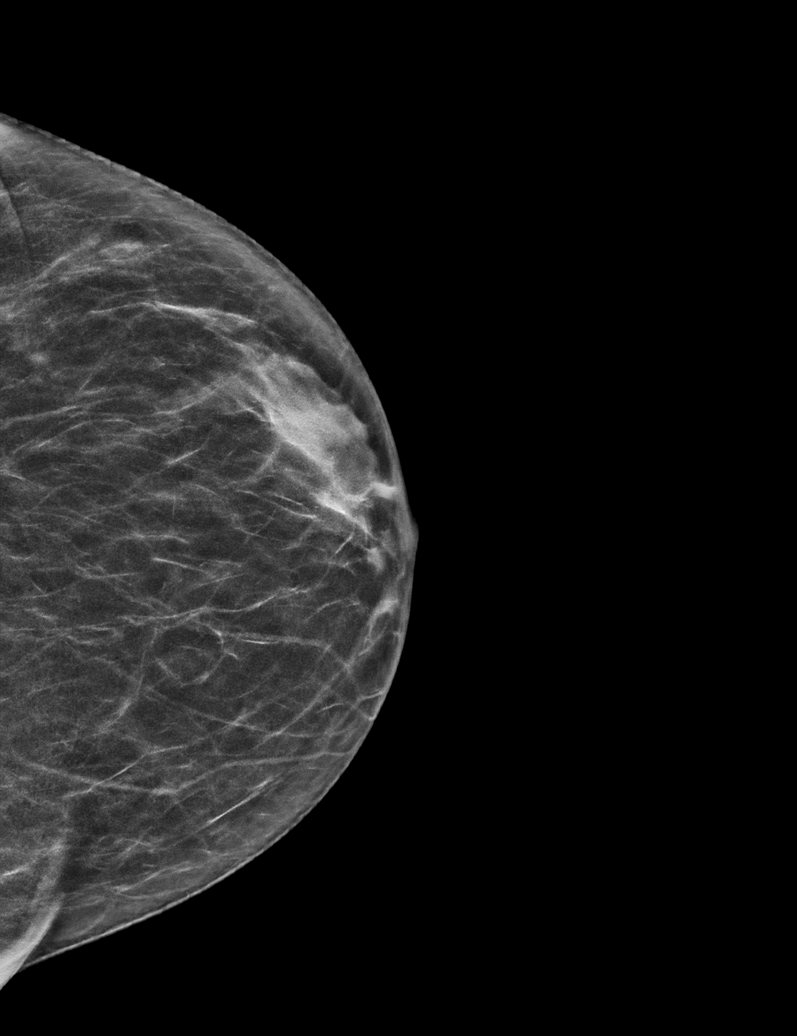

[R MLO tomo · tomo slice 29/57.0]
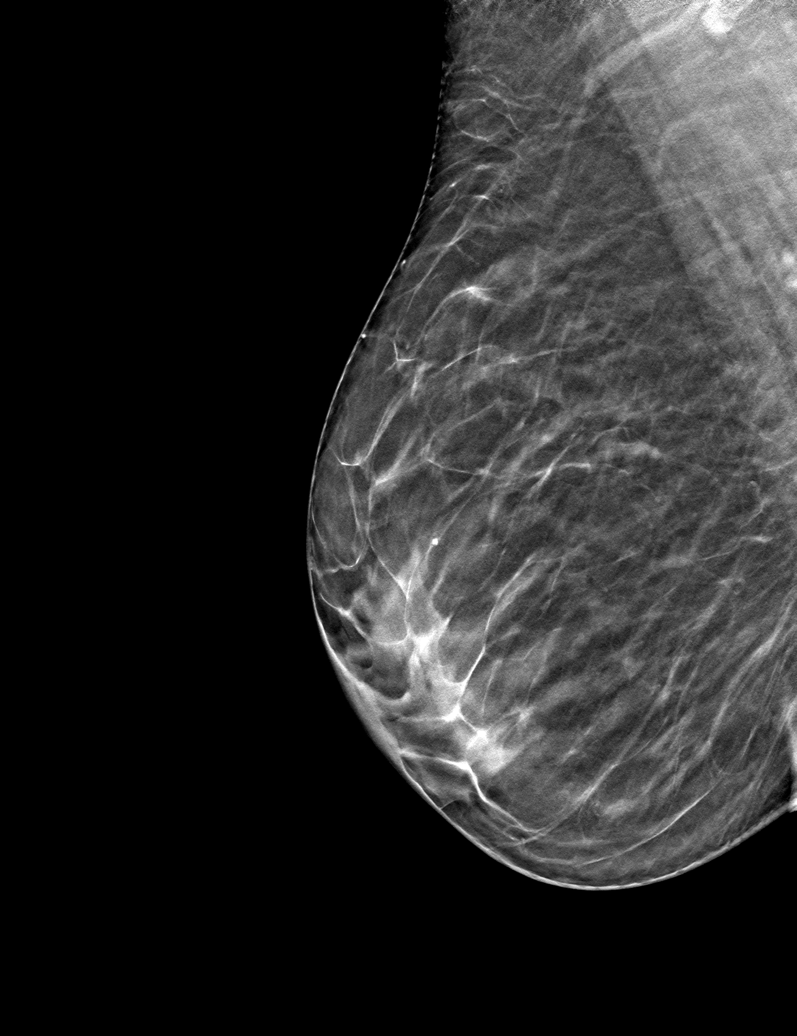

[6 of 30 positions shown; findings below may reference images not displayed]

ACR Breast Density Category b: There are scattered areas of
fibroglandular density.
FINDINGS: There are no findings suspicious for malignancy.
IMPRESSION: No mammographic evidence of malignancy. A result letter of this
screening mammogram will be mailed directly to the patient.

RECOMMENDATION:
Screening mammogram in one year. (Code:[id number]-[id number])

BI-RADS CATEGORY  1: Negative.

## 2022-12-20 ENCOUNTER — Ambulatory Visit: Payer: Self-pay | Admitting: Orthopedic Surgery

## 2022-12-23 ENCOUNTER — Ambulatory Visit: Payer: Self-pay | Admitting: Orthopedic Surgery

## 2022-12-23 NOTE — H&P (Signed)
Yolanda Gordon is an 62 y.o. female.   Chief Complaint: right shoulder pain HPI: Reason for Visit: (normal) review of test results (right shoulder CT arthrogram results) Context: The patient is 2 months out Location (Upper Extremity): shoulder pain on the right Severity: pain level 4/10 Timing: constant Aggravating Factors: ROM Associated Symptoms: weakness; no popping/clicking Medications: The patient is not taking any medications at this time  Past Medical History:  Diagnosis Date   Arthritis    Hypertension     Past Surgical History:  Procedure Laterality Date   TOTAL KNEE ARTHROPLASTY Right 02/18/2021   Procedure: TOTAL KNEE ARTHROPLASTY;  Surgeon: Jene Every, MD;  Location: [facility];  Service: Orthopedics;  Laterality: Right;   UTERINE FIBROID EMBOLIZATION      No family history on file. Social History:  reports that she has never smoked. She has never used smokeless tobacco. She reports that she does not drink alcohol and does not use drugs.  Allergies: No Known Allergies  No current medications  Review of Systems  Constitutional: Negative.   HENT: Negative.    Eyes: Negative.   Respiratory: Negative.    Cardiovascular: Negative.   Gastrointestinal: Negative.   Endocrine: Negative.   Genitourinary: Negative.   Musculoskeletal:  Positive for arthralgias and myalgias.  Skin: Negative.   Neurological: Negative.   Psychiatric/Behavioral: Negative.      There were no vitals taken for this visit. Physical Exam Constitutional:      Appearance: Normal appearance.  HENT:     Head: Normocephalic and atraumatic.     Right Ear: External ear normal.     Left Ear: External ear normal.     Nose: Nose normal.     Mouth/Throat:     Pharynx: Oropharynx is clear.  Eyes:     Conjunctiva/sclera: Conjunctivae normal.  Cardiovascular:     Rate and Rhythm: Normal rate and regular rhythm.     Pulses: Normal pulses.     Heart sounds: Normal heart sounds.  Pulmonary:      Effort: Pulmonary effort is normal.     Breath sounds: Normal breath sounds.  Abdominal:     General: Bowel sounds are normal.  Musculoskeletal:     Cervical back: Normal range of motion and neck supple.     Comments: Constitutional: General Appearance: healthy-appearing and NAD.  Psychiatric: Mood and Affect: normal mood and affect.  Cardiovascular System: Arterial Pulses Right: radial normal and brachial normal. Varicosities Right: no varicosities.  C-Spine/Neck: Active Range of Motion: flexion normal, extension normal, and no pain elicited on motion.  Shoulders: Inspection Right: no misalignment, atrophy, erythema, swelling, or scapular winging. Bony Palpation Right: no tenderness of the sternoclavicular joint, the coracoid process, the acromioclavicular joint, the bicipital groove, or the scapula. Soft Tissue Palpation Right: tenderness of the supraspinatus and the subacromial bursa. Active Range of Motion Right: limited. Special Tests Right: Speed's test negative and Neer's test positive. Stability Right: no laxity, sulcus sign negative, and anterior apprehension test negative. Strength Right: abduction 5/5, adduction 5/5, flexion 5/5, and extension 5/5.  Skin: Right Upper Extremity: normal.  Neurological System: Biceps Reflex Right: normal (2). Brachioradialis Reflex Right: normal (2). Triceps Reflex Right: normal (2). Sensation on the Right: C5 normal, C6 normal, and C7 normal.  Neurological:     Mental Status: She is alert.    CT arthrogram right shoulder demonstrated full-thickness linear tears of the supraspinatus infraspinatus. Subscapularis is intact. There is mild glenohumeral arthrosis mild to moderate AC arthrosis. An unfused  os acromiale.  Assessment/Plan Impression:  1. 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 Incidental os acromiale  Plan:  Given the persistent of her symptoms we discussed rotator cuff repair she  would like to proceed with that An extensive discussion concerning the pathology relevant anatomy and treatment options. After that discussion we mutually agreed to proceed with repair of the rotator cuff utilizing arthroscopic assistance if possible. The risks and benefits of that procedure were discussed including bleeding, infection, suboptimal range of motion, deep venous thrombosis, pulmonary embolism, anesthetic complications etc. in addition we discussed the postoperative course to include approximately 4 weeks of passive range of motion followed by 4 weeks of active range of motion followed by 4-12 weeks of progressive strengthening exercises. In addition we discussed protective activities to reduce the risk of a reinjury including impingement activities with elbow above the shoulder as well as reaching and repetitive circular motion activities. The hospital stay will either be as a outpatient with a regional block versus overnight depending upon the extent of the procedure and any challenging health issues with a first postoperative visit 2 weeks following the surgery. No history of DVT or MRSA. Aspirin postoperatively. Oxycodone. She is doing well with the right total knee replacement.  Plan right shoulder scope, SAD, mini-open RCR  Dorothy Spark, PA-C for Dr Shelle Iron 12/23/2022, 12:17 PM

## 2022-12-23 NOTE — H&P (View-Only) (Signed)
Yolanda Gordon is an 62 y.o. female.   Chief Complaint: right shoulder pain HPI: Reason for Visit: (normal) review of test results (right shoulder CT arthrogram results) Context: The patient is 2 months out Location (Upper Extremity): shoulder pain on the right Severity: pain level 4/10 Timing: constant Aggravating Factors: ROM Associated Symptoms: weakness; no popping/clicking Medications: The patient is not taking any medications at this time  Past Medical History:  Diagnosis Date   Arthritis    Hypertension     Past Surgical History:  Procedure Laterality Date   TOTAL KNEE ARTHROPLASTY Right 02/18/2021   Procedure: TOTAL KNEE ARTHROPLASTY;  Surgeon: Jene Every, MD;  Location: [facility];  Service: Orthopedics;  Laterality: Right;   UTERINE FIBROID EMBOLIZATION      No family history on file. Social History:  reports that she has never smoked. She has never used smokeless tobacco. She reports that she does not drink alcohol and does not use drugs.  Allergies: No Known Allergies  No current medications  Review of Systems  Constitutional: Negative.   HENT: Negative.    Eyes: Negative.   Respiratory: Negative.    Cardiovascular: Negative.   Gastrointestinal: Negative.   Endocrine: Negative.   Genitourinary: Negative.   Musculoskeletal:  Positive for arthralgias and myalgias.  Skin: Negative.   Neurological: Negative.   Psychiatric/Behavioral: Negative.      There were no vitals taken for this visit. Physical Exam Constitutional:      Appearance: Normal appearance.  HENT:     Head: Normocephalic and atraumatic.     Right Ear: External ear normal.     Left Ear: External ear normal.     Nose: Nose normal.     Mouth/Throat:     Pharynx: Oropharynx is clear.  Eyes:     Conjunctiva/sclera: Conjunctivae normal.  Cardiovascular:     Rate and Rhythm: Normal rate and regular rhythm.     Pulses: Normal pulses.     Heart sounds: Normal heart sounds.  Pulmonary:      Effort: Pulmonary effort is normal.     Breath sounds: Normal breath sounds.  Abdominal:     General: Bowel sounds are normal.  Musculoskeletal:     Cervical back: Normal range of motion and neck supple.     Comments: Constitutional: General Appearance: healthy-appearing and NAD.  Psychiatric: Mood and Affect: normal mood and affect.  Cardiovascular System: Arterial Pulses Right: radial normal and brachial normal. Varicosities Right: no varicosities.  C-Spine/Neck: Active Range of Motion: flexion normal, extension normal, and no pain elicited on motion.  Shoulders: Inspection Right: no misalignment, atrophy, erythema, swelling, or scapular winging. Bony Palpation Right: no tenderness of the sternoclavicular joint, the coracoid process, the acromioclavicular joint, the bicipital groove, or the scapula. Soft Tissue Palpation Right: tenderness of the supraspinatus and the subacromial bursa. Active Range of Motion Right: limited. Special Tests Right: Speed's test negative and Neer's test positive. Stability Right: no laxity, sulcus sign negative, and anterior apprehension test negative. Strength Right: abduction 5/5, adduction 5/5, flexion 5/5, and extension 5/5.  Skin: Right Upper Extremity: normal.  Neurological System: Biceps Reflex Right: normal (2). Brachioradialis Reflex Right: normal (2). Triceps Reflex Right: normal (2). Sensation on the Right: C5 normal, C6 normal, and C7 normal.  Neurological:     Mental Status: She is alert.    CT arthrogram right shoulder demonstrated full-thickness linear tears of the supraspinatus infraspinatus. Subscapularis is intact. There is mild glenohumeral arthrosis mild to moderate AC arthrosis. An unfused  os acromiale.  Assessment/Plan Impression:  1. 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 Incidental os acromiale  Plan:  Given the persistent of her symptoms we discussed rotator cuff repair she  would like to proceed with that An extensive discussion concerning the pathology relevant anatomy and treatment options. After that discussion we mutually agreed to proceed with repair of the rotator cuff utilizing arthroscopic assistance if possible. The risks and benefits of that procedure were discussed including bleeding, infection, suboptimal range of motion, deep venous thrombosis, pulmonary embolism, anesthetic complications etc. in addition we discussed the postoperative course to include approximately 4 weeks of passive range of motion followed by 4 weeks of active range of motion followed by 4-12 weeks of progressive strengthening exercises. In addition we discussed protective activities to reduce the risk of a reinjury including impingement activities with elbow above the shoulder as well as reaching and repetitive circular motion activities. The hospital stay will either be as a outpatient with a regional block versus overnight depending upon the extent of the procedure and any challenging health issues with a first postoperative visit 2 weeks following the surgery. No history of DVT or MRSA. Aspirin postoperatively. Oxycodone. She is doing well with the right total knee replacement.  Plan right shoulder scope, SAD, mini-open RCR  Dorothy Spark, PA-C for Dr Shelle Iron 12/23/2022, 12:17 PM

## 2022-12-31 NOTE — Patient Instructions (Addendum)
SURGICAL WAITING ROOM VISITATION  Patients having surgery or a procedure may have no more than 2 support people in the waiting area - these visitors may rotate.    Children under the age of 78 must have an adult with them who is not the patient.  Due to an increase in RSV and influenza rates and associated hospitalizations, children ages 9 and under may not visit patients in [facility] hospitals.  If the patient needs to stay at the hospital during part of their recovery, the visitor guidelines for inpatient rooms apply. Pre-op nurse will coordinate an appropriate time for 1 support person to accompany patient in pre-op.  This support person may not rotate.    Please refer to the [facility] website for the visitor guidelines for Inpatients (after your surgery is over and you are in a regular room).    Your procedure is scheduled on: 01/19/23   Report to [facility] Main Entrance    Report to admitting at 6:15 AM   Call this number if you have problems the morning of surgery [phone]   Do not eat food :After Midnight.   After Midnight you may have the following liquids until 5:30 AM DAY OF SURGERY  Water Non-Citrus Juices (without pulp, NO RED-Apple, White grape, White cranberry) Black Coffee (NO MILK/CREAM OR CREAMERS, sugar ok)  Clear Tea (NO MILK/CREAM OR CREAMERS, sugar ok) regular and decaf                             Plain Jell-O (NO RED)                                           Fruit ices (not with fruit pulp, NO RED)                                     Popsicles (NO RED)                                                               Sports drinks like Gatorade (NO RED)               The day of surgery:  Drink ONE (1) Pre-Surgery Clear Ensure at 5:30 AM the morning of surgery. Drink in one sitting. Do not sip.  This drink was given to you during your hospital  pre-op appointment visit. Nothing else to drink after completing the  Pre-Surgery Clear Ensure.           If you have questions, please contact your surgeon's office.   FOLLOW BOWEL PREP AND ANY ADDITIONAL PRE OP INSTRUCTIONS YOU RECEIVED FROM YOUR SURGEON'S OFFICE!!!     Oral Hygiene is also important to reduce your risk of infection.                                    Remember - BRUSH YOUR TEETH THE MORNING OF SURGERY WITH YOUR REGULAR TOOTHPASTE  DENTURES WILL BE REMOVED  PRIOR TO SURGERY PLEASE DO NOT APPLY "Poly grip" OR ADHESIVES!!!   Take these medicines the morning of surgery with A SIP OF WATER: Tylenol                              You may not have any metal on your body including hair pins, jewelry, and body piercing             Do not wear make-up, lotions, powders, perfumes, or deodorant  Do not wear nail polish including gel and S&S, artificial/acrylic nails, or any other type of covering on natural nails including finger and toenails. If you have artificial nails, gel coating, etc. that needs to be removed by a nail salon please have this removed prior to surgery or surgery may need to be canceled/ delayed if the surgeon/ anesthesia feels like they are unable to be safely monitored.   Do not shave  48 hours prior to surgery.    Do not bring valuables to the hospital. [facility] IS NOT             RESPONSIBLE   FOR VALUABLES.   Contacts, glasses, dentures or bridgework may not be worn into surgery.  DO NOT BRING YOUR HOME MEDICATIONS TO THE HOSPITAL. PHARMACY WILL DISPENSE MEDICATIONS LISTED ON YOUR MEDICATION LIST TO YOU DURING YOUR ADMISSION IN THE HOSPITAL!    Patients discharged on the day of surgery will not be allowed to drive home.  Someone NEEDS to stay with you for the first 24 hours after anesthesia.              Please read over the following fact sheets you were given: IF YOU HAVE QUESTIONS ABOUT YOUR PRE-OP INSTRUCTIONS PLEASE CALL [name]    If you received a COVID test during your pre-op visit  it is requested that you wear a mask when out  in public, stay away from anyone that may not be feeling well and notify your surgeon if you develop symptoms. If you test positive for Covid or have been in contact with anyone that has tested positive in the last 10 days please notify you surgeon.    [facility] - Preparing for Surgery Before surgery, you can play an important role.  Because skin is not sterile, your skin needs to be as free of germs as possible.  You can reduce the number of germs on your skin by washing with CHG (chlorahexidine gluconate) soap before surgery.  CHG is an antiseptic cleaner which kills germs and bonds with the skin to continue killing germs even after washing. Please DO NOT use if you have an allergy to CHG or antibacterial soaps.  If your skin becomes reddened/irritated stop using the CHG and inform your nurse when you arrive at Short Stay. Do not shave (including legs and underarms) for at least 48 hours prior to the first CHG shower.  You may shave your face/neck.  Please follow these instructions carefully:  1.  Shower with CHG Soap the night before surgery and the  morning of surgery.  2.  If you choose to wash your hair, wash your hair first as usual with your normal  shampoo.  3.  After you shampoo, rinse your hair and body thoroughly to remove the shampoo.  4.  Use CHG as you would any other liquid soap.  You can apply chg directly to the skin and wash.  Gently with a scrungie or clean washcloth.  5.  Apply the CHG Soap to your body ONLY FROM THE NECK DOWN.   Do   not use on face/ open                           Wound or open sores. Avoid contact with eyes, ears mouth and   genitals (private parts).                       Wash face,  Genitals (private parts) with your normal soap.             6.  Wash thoroughly, paying special attention to the area where your    surgery  will be performed.  7.  Thoroughly rinse your body with warm water from the neck down.  8.  DO NOT shower/wash  with your normal soap after using and rinsing off the CHG Soap.                9.  Pat yourself dry with a clean towel.            10.  Wear clean pajamas.            11.  Place clean sheets on your bed the night of your first shower and do not  sleep with pets. Day of Surgery : Do not apply any lotions/deodorants the morning of surgery.  Please wear clean clothes to the hospital/surgery center.  FAILURE TO FOLLOW THESE INSTRUCTIONS MAY RESULT IN THE CANCELLATION OF YOUR SURGERY  PATIENT SIGNATURE_________________________________  NURSE SIGNATURE__________________________________  ________________________________________________________________________  Yolanda Gordon  An incentive spirometer is a tool that can help keep your lungs clear and active. This tool measures how well you are filling your lungs with each breath. Taking long deep breaths may help reverse or decrease the chance of developing breathing (pulmonary) problems (especially infection) following: A long period of time when you are unable to move or be active. BEFORE THE PROCEDURE  If the spirometer includes an indicator to show your best effort, your nurse or respiratory therapist will set it to a desired goal. If possible, sit up straight or lean slightly forward. Try not to slouch. Hold the incentive spirometer in an upright position. INSTRUCTIONS FOR USE  Sit on the edge of your bed if possible, or sit up as far as you can in bed or on a chair. Hold the incentive spirometer in an upright position. Breathe out normally. Place the mouthpiece in your mouth and seal your lips tightly around it. Breathe in slowly and as deeply as possible, raising the piston or the ball toward the top of the column. Hold your breath for 3-5 seconds or for as long as possible. Allow the piston or ball to fall to the bottom of the column. Remove the mouthpiece from your mouth and breathe out normally. Rest for a few seconds and repeat  Steps 1 through 7 at least 10 times every 1-2 hours when you are awake. Take your time and take a few normal breaths between deep breaths. The spirometer may include an indicator to show your best effort. Use the indicator as a goal to work toward during each repetition. After each set of 10 deep breaths, practice coughing to be sure your  lungs are clear. If you have an incision (the cut made at the time of surgery), support your incision when coughing by placing a pillow or rolled up towels firmly against it. Once you are able to get out of bed, walk around indoors and cough well. You may stop using the incentive spirometer when instructed by your caregiver.  RISKS AND COMPLICATIONS Take your time so you do not get dizzy or light-headed. If you are in pain, you may need to take or ask for pain medication before doing incentive spirometry. It is harder to take a deep breath if you are having pain. AFTER USE Rest and breathe slowly and easily. It can be helpful to keep track of a log of your progress. Your caregiver can provide you with a simple table to help with this. If you are using the spirometer at home, follow these instructions: SEEK MEDICAL CARE IF:  You are having difficultly using the spirometer. You have trouble using the spirometer as often as instructed. Your pain medication is not giving enough relief while using the spirometer. You develop fever of 100.5 F (38.1 C) or higher. SEEK IMMEDIATE MEDICAL CARE IF:  You cough up bloody sputum that had not been present before. You develop fever of 102 F (38.9 C) or greater. You develop worsening pain at or near the incision site. MAKE SURE YOU:  Understand these instructions. Will watch your condition. Will get help right away if you are not doing well or get worse. Document Released: 10/11/2006 Document Revised: 08/23/2011 Document Reviewed: 12/12/2006 [facility] Patient Information 2014 [place],  [place].   ________________________________________________________________________

## 2022-12-31 NOTE — Progress Notes (Signed)
COVID Vaccine Completed:  Date of COVID positive in last 90 days:  PCP - Mary-Margaret Daphine Deutscher, FNP Cardiologist -   Chest x-ray -  EKG -  Stress Test -  ECHO -  Cardiac Cath -  Pacemaker/ICD device last checked: Spinal Cord Stimulator:  Bowel Prep -   Sleep Study -  CPAP -   Fasting Blood Sugar -  Checks Blood Sugar _____ times a day  Last dose of GLP1 agonist-  N/A GLP1 instructions:  N/A   Last dose of SGLT-2 inhibitors-  N/A SGLT-2 instructions: N/A   Blood Thinner Instructions:  Time Aspirin Instructions: Last Dose:  Activity level:  Can go up a flight of stairs and perform activities of daily living without stopping and without symptoms of chest pain or shortness of breath.  Able to exercise without symptoms  Unable to go up a flight of stairs without symptoms of     Anesthesia review:   Patient denies shortness of breath, fever, cough and chest pain at PAT appointment  Patient verbalized understanding of instructions that were given to them at the PAT appointment. Patient was also instructed that they will need to review over the PAT instructions again at home before surgery.

## 2023-01-03 ENCOUNTER — Encounter (HOSPITAL_COMMUNITY): Payer: Self-pay

## 2023-01-03 ENCOUNTER — Encounter (HOSPITAL_COMMUNITY)
Admission: RE | Admit: 2023-01-03 | Discharge: 2023-01-03 | Disposition: A | Discharge status: Home / Self Care | Payer: BC Managed Care – PPO | Source: Ambulatory Visit | Attending: Specialist | Admitting: Specialist

## 2023-01-03 ENCOUNTER — Other Ambulatory Visit: Payer: Self-pay

## 2023-01-03 VITALS — BP 123/79 | HR 68 | Temp 98.0°F | Resp 16 | Ht 62.0 in

## 2023-01-03 DIAGNOSIS — Z01818 Encounter for other preprocedural examination: Secondary | ICD-10-CM | POA: Insufficient documentation

## 2023-01-03 DIAGNOSIS — I10 Essential (primary) hypertension: Secondary | ICD-10-CM | POA: Diagnosis not present

## 2023-01-03 LAB — BASIC METABOLIC PANEL
Anion gap: 8 (ref 5–15)
BUN: 8 mg/dL (ref 8–23)
CO2: 23 mmol/L (ref 22–32)
Calcium: 9 mg/dL (ref 8.9–10.3)
Chloride: 108 mmol/L (ref 98–111)
Creatinine, Ser: 0.67 mg/dL (ref 0.44–1.00)
GFR, Estimated: >60 mL/min (ref >60)
Glucose, Bld: 96 mg/dL (ref 70–99)
Potassium: 4.5 mmol/L (ref 3.5–5.1)
Sodium: 139 mmol/L (ref 135–145)

## 2023-01-03 LAB — CBC
HCT: 39.4 % (ref 36.0–46.0)
Hemoglobin: 13 g/dL (ref 12.0–15.0)
MCH: 30.2 pg (ref 26.0–34.0)
MCHC: 33 g/dL (ref 30.0–36.0)
MCV: 91.6 fL (ref 80.0–100.0)
Platelets: 345 10*3/uL (ref 150–400)
RBC: 4.3 MIL/uL (ref 3.87–5.11)
RDW: 12.9 % (ref 11.5–15.5)
WBC: 5.3 10*3/uL (ref 4.0–10.5)
nRBC: 0 % (ref 0.0–0.2)

## 2023-01-18 ENCOUNTER — Encounter (HOSPITAL_COMMUNITY): Payer: Self-pay | Admitting: Specialist

## 2023-01-18 NOTE — Anesthesia Preprocedure Evaluation (Signed)
Anesthesia Evaluation  Patient identified by MRN, date of birth, ID band Patient awake    Reviewed: Allergy & Precautions, NPO status , Patient's Chart, lab work & pertinent test results  Airway Mallampati: II  TM Distance: >3 FB     Dental  (+) Edentulous Upper, Edentulous Lower   Pulmonary neg pulmonary ROS   Pulmonary exam normal breath sounds clear to auscultation       Cardiovascular hypertension, Normal cardiovascular exam Rhythm:Regular Rate:Normal  EKG 01/03/23 NSR, Normal  Patient denies HTN   Neuro/Psych negative neurological ROS  negative psych ROS   GI/Hepatic negative GI ROS, Neg liver ROS,,,  Endo/Other  negative endocrine ROS    Renal/GU negative Renal ROS  negative genitourinary   Musculoskeletal  (+) Arthritis ,  Rotator cuff tear right shoulder   Abdominal Normal abdominal exam  (+)   Peds  Hematology negative hematology ROS (+)   Anesthesia Other Findings   Reproductive/Obstetrics                              Anesthesia Physical Anesthesia Plan  ASA: 2  Anesthesia Plan: General   Post-op Pain Management: Minimal or no pain anticipated and Regional block*   Induction: Intravenous  PONV Risk Score and Plan: 4 or greater and Treatment may vary due to age or medical condition, Ondansetron, Dexamethasone and Midazolam  Airway Management Planned: Oral ETT  Additional Equipment: None  Intra-op Plan:   Post-operative Plan: Extubation in OR  Informed Consent: I have reviewed the patients History and Physical, chart, labs and discussed the procedure including the risks, benefits and alternatives for the proposed anesthesia with the patient or authorized representative who has indicated his/her understanding and acceptance.     Dental advisory given  Plan Discussed with: Anesthesiologist and CRNA  Anesthesia Plan Comments:         Anesthesia Quick  Evaluation

## 2023-01-19 ENCOUNTER — Ambulatory Visit (HOSPITAL_COMMUNITY): Payer: BC Managed Care – PPO | Admitting: Certified Registered"

## 2023-01-19 ENCOUNTER — Other Ambulatory Visit: Payer: Self-pay

## 2023-01-19 ENCOUNTER — Encounter (HOSPITAL_COMMUNITY): Payer: Self-pay | Admitting: Specialist

## 2023-01-19 ENCOUNTER — Encounter (HOSPITAL_COMMUNITY)
Admission: RE | Disposition: A | Discharge status: Home / Self Care | Payer: Self-pay | Source: Ambulatory Visit | Attending: Specialist

## 2023-01-19 ENCOUNTER — Ambulatory Visit (HOSPITAL_COMMUNITY)
Admission: RE | Admit: 2023-01-19 | Discharge: 2023-01-19 | Disposition: A | Discharge status: Home / Self Care | Payer: BC Managed Care – PPO | Source: Ambulatory Visit | Attending: Specialist | Admitting: Specialist

## 2023-01-19 DIAGNOSIS — X58XXXA Exposure to other specified factors, initial encounter: Secondary | ICD-10-CM | POA: Diagnosis not present

## 2023-01-19 DIAGNOSIS — M1990 Unspecified osteoarthritis, unspecified site: Secondary | ICD-10-CM | POA: Insufficient documentation

## 2023-01-19 DIAGNOSIS — S46011A Strain of muscle(s) and tendon(s) of the rotator cuff of right shoulder, initial encounter: Secondary | ICD-10-CM | POA: Insufficient documentation

## 2023-01-19 DIAGNOSIS — Z96651 Presence of right artificial knee joint: Secondary | ICD-10-CM | POA: Insufficient documentation

## 2023-01-19 DIAGNOSIS — M7541 Impingement syndrome of right shoulder: Secondary | ICD-10-CM | POA: Insufficient documentation

## 2023-01-19 HISTORY — PX: SHOULDER ARTHROSCOPY WITH ROTATOR CUFF REPAIR AND SUBACROMIAL DECOMPRESSION: SHX5686

## 2023-01-19 SURGERY — SHOULDER ARTHROSCOPY WITH ROTATOR CUFF REPAIR AND SUBACROMIAL DECOMPRESSION
Anesthesia: General | Laterality: Right

## 2023-01-19 MED ORDER — SUGAMMADEX SODIUM 200 MG/2ML IV SOLN
INTRAVENOUS | Status: DC | PRN
  Administered 2023-01-19: 200 mg via INTRAVENOUS

## 2023-01-19 MED ORDER — LACTATED RINGERS IV SOLN: INTRAVENOUS | Status: DC

## 2023-01-19 MED ORDER — ONDANSETRON HCL 4 MG/2ML IJ SOLN: 4.0000 mg | Freq: Once | INTRAMUSCULAR | Status: DC | PRN

## 2023-01-19 MED ORDER — CHLORHEXIDINE GLUCONATE 0.12 % MT SOLN: 15.0000 mL | Freq: Once | OROMUCOSAL | Status: DC

## 2023-01-19 MED ORDER — ROCURONIUM BROMIDE 10 MG/ML (PF) SYRINGE
PREFILLED_SYRINGE | INTRAVENOUS | Status: AC
  Filled 2023-01-19: qty 10

## 2023-01-19 MED ORDER — FENTANYL CITRATE (PF) 100 MCG/2ML IJ SOLN
INTRAMUSCULAR | Status: AC
  Filled 2023-01-19: qty 2

## 2023-01-19 MED ORDER — MIDAZOLAM HCL 2 MG/2ML IJ SOLN: 2.0000 mg | INTRAMUSCULAR | Status: DC

## 2023-01-19 MED ORDER — FENTANYL CITRATE PF 50 MCG/ML IJ SOSY: 25.0000 ug | PREFILLED_SYRINGE | INTRAMUSCULAR | Status: DC | PRN

## 2023-01-19 MED ORDER — FENTANYL CITRATE (PF) 100 MCG/2ML IJ SOLN
INTRAMUSCULAR | Status: DC | PRN
  Administered 2023-01-19 (×2): 50 ug via INTRAVENOUS

## 2023-01-19 MED ORDER — EPHEDRINE SULFATE-NACL 50-0.9 MG/10ML-% IV SOSY
PREFILLED_SYRINGE | INTRAVENOUS | Status: DC | PRN
  Administered 2023-01-19: 5 mg via INTRAVENOUS

## 2023-01-19 MED ORDER — BUPIVACAINE-EPINEPHRINE 0.5% -1:200000 IJ SOLN
INTRAMUSCULAR | Status: DC | PRN
  Administered 2023-01-19: 30 mL

## 2023-01-19 MED ORDER — DEXAMETHASONE SODIUM PHOSPHATE 10 MG/ML IJ SOLN
INTRAMUSCULAR | Status: AC
  Filled 2023-01-19: qty 1

## 2023-01-19 MED ORDER — LIDOCAINE 2% (20 MG/ML) 5 ML SYRINGE
INTRAMUSCULAR | Status: DC | PRN
  Administered 2023-01-19: 40 mg via INTRAVENOUS

## 2023-01-19 MED ORDER — MIDAZOLAM HCL 2 MG/2ML IJ SOLN
INTRAMUSCULAR | Status: DC | PRN
  Administered 2023-01-19: 2 mg via INTRAVENOUS

## 2023-01-19 MED ORDER — POLYETHYLENE GLYCOL 3350 17 G PO PACK: 17.0000 g | PACK | Freq: Every day | ORAL | 0 refills | Status: AC

## 2023-01-19 MED ORDER — TRANEXAMIC ACID-NACL 1000-0.7 MG/100ML-% IV SOLN
1000.0000 mg | INTRAVENOUS | Status: AC
  Administered 2023-01-19: 1000 mg via INTRAVENOUS
  Filled 2023-01-19: qty 100

## 2023-01-19 MED ORDER — PROPOFOL 10 MG/ML IV BOLUS
INTRAVENOUS | Status: AC
  Filled 2023-01-19: qty 20

## 2023-01-19 MED ORDER — OXYCODONE HCL 5 MG/5ML PO SOLN: 5.0000 mg | Freq: Once | ORAL | Status: DC | PRN

## 2023-01-19 MED ORDER — DEXAMETHASONE SODIUM PHOSPHATE 10 MG/ML IJ SOLN
INTRAMUSCULAR | Status: DC | PRN
  Administered 2023-01-19: 4 mg via INTRAVENOUS

## 2023-01-19 MED ORDER — PROPOFOL 10 MG/ML IV BOLUS
INTRAVENOUS | Status: DC | PRN
  Administered 2023-01-19: 120 mg via INTRAVENOUS

## 2023-01-19 MED ORDER — BUPIVACAINE LIPOSOME 1.3 % IJ SUSP
INTRAMUSCULAR | Status: DC | PRN
  Administered 2023-01-19: 10 mL via PERINEURAL

## 2023-01-19 MED ORDER — FENTANYL CITRATE PF 50 MCG/ML IJ SOSY: 100.0000 ug | PREFILLED_SYRINGE | INTRAMUSCULAR | Status: DC

## 2023-01-19 MED ORDER — CEPHALEXIN 500 MG PO CAPS: 500.0000 mg | ORAL_CAPSULE | Freq: Four times a day (QID) | ORAL | 1 refills | Status: DC

## 2023-01-19 MED ORDER — MIDAZOLAM HCL 2 MG/2ML IJ SOLN
INTRAMUSCULAR | Status: AC
  Filled 2023-01-19: qty 2

## 2023-01-19 MED ORDER — ONDANSETRON HCL 4 MG/2ML IJ SOLN
INTRAMUSCULAR | Status: AC
  Filled 2023-01-19: qty 2

## 2023-01-19 MED ORDER — LACTATED RINGERS IR SOLN
Status: DC | PRN
  Administered 2023-01-19 (×2): 3000 mL

## 2023-01-19 MED ORDER — PHENYLEPHRINE 80 MCG/ML (10ML) SYRINGE FOR IV PUSH (FOR BLOOD PRESSURE SUPPORT)
PREFILLED_SYRINGE | INTRAVENOUS | Status: AC
  Filled 2023-01-19: qty 10

## 2023-01-19 MED ORDER — DOCUSATE SODIUM 100 MG PO CAPS: 100.0000 mg | ORAL_CAPSULE | Freq: Two times a day (BID) | ORAL | 2 refills | Status: AC

## 2023-01-19 MED ORDER — ORAL CARE MOUTH RINSE: 15.0000 mL | Freq: Once | OROMUCOSAL | Status: DC

## 2023-01-19 MED ORDER — BUPIVACAINE-EPINEPHRINE (PF) 0.5% -1:200000 IJ SOLN
INTRAMUSCULAR | Status: AC
  Filled 2023-01-19: qty 30

## 2023-01-19 MED ORDER — AMISULPRIDE (ANTIEMETIC) 5 MG/2ML IV SOLN: 10.0000 mg | Freq: Once | INTRAVENOUS | Status: DC | PRN

## 2023-01-19 MED ORDER — METHOCARBAMOL 500 MG PO TABS: 500.0000 mg | ORAL_TABLET | Freq: Three times a day (TID) | ORAL | 1 refills | Status: DC | PRN

## 2023-01-19 MED ORDER — CEFAZOLIN SODIUM-DEXTROSE 2-4 GM/100ML-% IV SOLN
2.0000 g | INTRAVENOUS | Status: AC
  Administered 2023-01-19: 2 g via INTRAVENOUS
  Filled 2023-01-19: qty 100

## 2023-01-19 MED ORDER — OXYCODONE HCL 5 MG PO TABS: 5.0000 mg | ORAL_TABLET | Freq: Once | ORAL | Status: DC | PRN

## 2023-01-19 MED ORDER — EPINEPHRINE PF 1 MG/ML IJ SOLN
INTRAMUSCULAR | Status: AC
  Filled 2023-01-19: qty 1

## 2023-01-19 MED ORDER — BUPIVACAINE HCL (PF) 0.5 % IJ SOLN
INTRAMUSCULAR | Status: DC | PRN
  Administered 2023-01-19: 15 mL via PERINEURAL

## 2023-01-19 MED ORDER — ONDANSETRON HCL 4 MG/2ML IJ SOLN
INTRAMUSCULAR | Status: DC | PRN
  Administered 2023-01-19: 4 mg via INTRAVENOUS

## 2023-01-19 MED ORDER — OXYCODONE HCL 5 MG PO TABS
5.0000 mg | ORAL_TABLET | ORAL | 0 refills | Status: AC | PRN
Start: 2023-01-19 — End: ?

## 2023-01-19 MED ORDER — ASPIRIN 81 MG PO TBEC: 81.0000 mg | DELAYED_RELEASE_TABLET | Freq: Every day | ORAL | 1 refills | Status: AC

## 2023-01-19 MED ORDER — PHENYLEPHRINE HCL-NACL 20-0.9 MG/250ML-% IV SOLN
INTRAVENOUS | Status: DC | PRN
  Administered 2023-01-19: 20 ug/min via INTRAVENOUS

## 2023-01-19 MED ORDER — PHENYLEPHRINE 80 MCG/ML (10ML) SYRINGE FOR IV PUSH (FOR BLOOD PRESSURE SUPPORT)
PREFILLED_SYRINGE | INTRAVENOUS | Status: DC | PRN
  Administered 2023-01-19 (×3): 80 ug via INTRAVENOUS

## 2023-01-19 MED ORDER — ROCURONIUM BROMIDE 10 MG/ML (PF) SYRINGE
PREFILLED_SYRINGE | INTRAVENOUS | Status: DC | PRN
  Administered 2023-01-19: 50 mg via INTRAVENOUS
  Administered 2023-01-19 (×2): 10 mg via INTRAVENOUS

## 2023-01-19 SURGICAL SUPPLY — 59 items
ANCH SUT 2 SWLK 19.1 CLS EYLT (Anchor) ×3 IMPLANT
ANCH SUT FBRTAPE 1.3X2.6X1.7 (Anchor) IMPLANT
ANCH SUT SWLK 19.1X4.75 VT (Anchor) IMPLANT
ANCHOR NDL 9/16 CIR SZ 8 (NEEDLE) IMPLANT
ANCHOR NEEDLE 9/16 CIR SZ 8 (NEEDLE) ×1
ANCHOR SWIVELOCK BIO 4.75X19.1 (Anchor) IMPLANT
BAG COUNTER SPONGE SURGICOUNT (BAG) IMPLANT
BAG SPNG CNTER NS LX DISP (BAG) ×1
BLADE OSCILLATING/SAGITTAL (BLADE) ×1
BLADE SURG SZ11 CARB STEEL (BLADE) ×1 IMPLANT
BLADE SW THK.38XMED LNG THN (BLADE) ×1 IMPLANT
CANNULA ACUFO 5X76 (CANNULA) ×1 IMPLANT
COVER SURGICAL LIGHT HANDLE (MISCELLANEOUS) ×1 IMPLANT
DRAPE ORTHO SPLIT 77X108 STRL (DRAPES) ×1
DRAPE POUCH INSTRU U-SHP 10X18 (DRAPES) ×1 IMPLANT
DRAPE STERI 35X30 U-POUCH (DRAPES) ×1 IMPLANT
DRAPE SURG ORHT 6 SPLT 77X108 (DRAPES) IMPLANT
DRSG AQUACEL AG ADV 3.5X 4 (GAUZE/BANDAGES/DRESSINGS) IMPLANT
DRSG AQUACEL AG ADV 3.5X 6 (GAUZE/BANDAGES/DRESSINGS) IMPLANT
DURAPREP 26ML APPLICATOR (WOUND CARE) ×1 IMPLANT
DW OUTFLOW CASSETTE/TUBE SET (MISCELLANEOUS) ×1 IMPLANT
ELECT NDL TIP 2.8 STRL (NEEDLE) ×1 IMPLANT
ELECT NEEDLE TIP 2.8 STRL (NEEDLE) ×1
ELECT REM PT RETURN 15FT ADLT (MISCELLANEOUS) ×1 IMPLANT
GAUZE PAD ABD 8X10 STRL (GAUZE/BANDAGES/DRESSINGS) IMPLANT
GLOVE BIOGEL PI IND STRL 7.0 (GLOVE) ×1 IMPLANT
GLOVE SURG SS PI 8.0 STRL IVOR (GLOVE) ×2 IMPLANT
GOWN STRL REUS W/ TWL XL LVL3 (GOWN DISPOSABLE) ×2 IMPLANT
GOWN STRL REUS W/TWL XL LVL3 (GOWN DISPOSABLE) ×2
KIT BASIN OR (CUSTOM PROCEDURE TRAY) ×1 IMPLANT
KIT TURNOVER KIT A (KITS) IMPLANT
MANIFOLD NEPTUNE II (INSTRUMENTS) ×1 IMPLANT
NDL SCORPION MULTI FIRE (NEEDLE) IMPLANT
NDL SPNL 18GX3.5 QUINCKE PK (NEEDLE) ×1 IMPLANT
NEEDLE SCORPION MULTI FIRE (NEEDLE) ×1
NEEDLE SPNL 18GX3.5 QUINCKE PK (NEEDLE) ×1
PACK SHOULDER (CUSTOM PROCEDURE TRAY) ×1 IMPLANT
PORT APPOLLO RF 90DEGREE MULTI (SURGICAL WAND) IMPLANT
PROTECTOR NERVE ULNAR (MISCELLANEOUS) ×1 IMPLANT
SLING ARM IMMOBILIZER LRG (SOFTGOODS) IMPLANT
SLING ARM IMMOBILIZER MED (SOFTGOODS) IMPLANT
STRIP CLOSURE SKIN 1/2X4 (GAUZE/BANDAGES/DRESSINGS) IMPLANT
SUCTION TUBE FRAZIER 10FR DISP (SUCTIONS) ×1 IMPLANT
SUT ETHIBOND NAB CT1 #1 30IN (SUTURE) IMPLANT
SUT ETHILON 4 0 PS 2 18 (SUTURE) ×1 IMPLANT
SUT FIBERWIRE #2 38 T-5 BLUE (SUTURE)
SUT PROLENE 3 0 PS 2 (SUTURE) IMPLANT
SUT TIGER TAPE 7 IN WHITE (SUTURE) IMPLANT
SUT VIC AB 1-0 CT2 27 (SUTURE) IMPLANT
SUT VIC AB 2-0 CT2 27 (SUTURE) IMPLANT
SUT VICRYL 0 UR6 27IN ABS (SUTURE) IMPLANT
SUTURE FIBERWR #2 38 T-5 BLUE (SUTURE) IMPLANT
TAPE FIBER 2MM 7IN #2 BLUE (SUTURE) IMPLANT
TOWEL OR 17X26 10 PK STRL BLUE (TOWEL DISPOSABLE) ×1 IMPLANT
TOWEL OR NON WOVEN STRL DISP B (DISPOSABLE) IMPLANT
TUBING ARTHROSCOPY IRRIG 16FT (MISCELLANEOUS) ×1 IMPLANT
TUBING CONNECTING 10 (TUBING) ×1 IMPLANT
WAND ABLATOR APOLLO I90 (BUR) IMPLANT
WIPE CHG 2% PREP (PERSONAL CARE ITEMS) ×1 IMPLANT

## 2023-01-19 NOTE — Anesthesia Postprocedure Evaluation (Signed)
Anesthesia Post Note  Patient: Yolanda Gordon  Procedure(s) Performed: SHOULDER ARTHROSCOPY WITH MINI OPEN ROTATOR CUFF REPAIR AND SUBACROMIAL DECOMPRESSION (Right)     Patient location during evaluation: PACU Anesthesia Type: General Level of consciousness: awake and alert and oriented Pain management: pain level controlled Vital Signs Assessment: post-procedure vital signs reviewed and stable Respiratory status: spontaneous breathing, nonlabored ventilation and respiratory function stable Cardiovascular status: blood pressure returned to baseline and stable Postop Assessment: no apparent nausea or vomiting Anesthetic complications: no   No notable events documented.  Last Vitals:  Vitals:   01/19/23 1100 01/19/23 1130  BP: (!) 140/70 (!) 144/69  Pulse: (!) 127 87  Resp: (!) 22   Temp:  37 C  SpO2: 98% 98%    Last Pain:  Vitals:   01/19/23 1130  TempSrc:   PainSc: 0-No pain                 Tawnya Pujol A.

## 2023-01-19 NOTE — Interval H&P Note (Signed)
History and Physical Interval Note:  01/19/2023 8:10 AM  Yolanda Gordon  has presented today for surgery, with the diagnosis of Right shoulder rotator cuff tear.  The various methods of treatment have been discussed with the patient and family. After consideration of risks, benefits and other options for treatment, the patient has consented to  Procedure(s): SHOULDER ARTHROSCOPY WITH MINI OPEN ROTATOR CUFF REPAIR AND SUBACROMIAL DECOMPRESSION (Right) as a surgical intervention.  The patient's history has been reviewed, patient examined, no change in status, stable for surgery.  I have reviewed the patient's chart and labs.  Questions were answered to the patient's satisfaction.     Javier Docker

## 2023-01-19 NOTE — Op Note (Unsigned)
NAME: Yolanda Gordon, Yolanda Gordon MEDICAL RECORD NO: [id number] ACCOUNT NO: [id number] DATE OF BIRTH: [birth year] FACILITY: [facility] LOCATION: [facility] PHYSICIAN: Javier Docker, MD  Operative Report   DATE OF PROCEDURE: 01/19/2023  PREOPERATIVE DIAGNOSIS: 1.  Acute rotator cuff tear, infraspinatus supraspinatus, right shoulder. 2.  Impingement syndrome, right shoulder.  POSTOPERATIVE DIAGNOSES: 1.  Acute rotator cuff tear, infraspinatus supraspinatus, right shoulder. 2.  Impingement syndrome, right shoulder. 3.  Anterior superior labral tear.  PROCEDURE PERFORMED:   1.  Right shoulder arthroscopy with extensive debridement including subacromial bursa, subdeltoid bursa, anterior superior labrum. 2.  Subacromial decompression with release of the CA ligament and acromioplasty. 3.  Mini open rotator cuff repair.  ANESTHESIA:  General with regional block.  ASSISTANT:  Andrez Grime, PA.  HISTORY:  A 62 year old female with acute tear of the rotator cuff by CT arthrogram.  She was indicated for repair.  She had an os acromiale.  Spur off the anterolateral aspect of the acromion.  She was indicated for a subacromial decompression, shoulder  arthroscopy, debridement, mini open rotator cuff repair.  Risks and benefits discussed including bleeding, infection, damage to neurovascular structures, no change in symptoms, worsening symptoms, DVT, PE, anesthetic complications, etc.  TECHNIQUE:  The patient in supine beach chair position, after induction of adequate general anesthesia, 2 grams Kefzol and a regional block. The right shoulder and upper extremity was prepped and draped in the usual sterile fashion.  A surgical marker  was utilized to delineate the acromion, AC joint coracoid.  Standard posterolateral portal was utilized with incision through the skin only with a #11 blade.  The arthroscopic cannula was then inserted in the glenohumeral joint in line with the coracoid  with a 30, 70 degree  traction applied to the arm.  Penetrating the capsule atraumatically irrigant was utilized to insufflate the joint 35 mmHg.  Inspection revealed full thickness tear of the rotator cuff.  Biceps tendon was intact.  There was a  degenerative tearing of the labrum, superior and anterior.  Some synovitis was noted.  Minimal arthrosis.  Under direct visualization, anterior portal was fashioned with a #11 blade after localization with 18-gauge needle, midway between the coracoid and  the anterolateral aspect of the acromion, under direct visualization beneath the biceps tendon in the rotator interval.  Introduced a blunt cannula in the glenohumeral joint without difficulty beneath the biceps tendon.  I introduced the shaver to  debride the labrum and then an ArthroWand further contour was not detached.  Debrided some of the anterior synovitis as well.  Biceps tendon was intact.  Subscap was intact.  Lavaged the joint and then removed the shaver.  I redirected the arthroscopic  camera in the subacromial space.  I fashioned anterolateral portal with #11 blade through skin only and triangulated in the subacromial space.  Hypertrophic bursa was noted.  I introduced a shaver and performed a subdeltoid and subacromial bursectomy.  I  then used an ArthroWand and morselized the CA ligament.  We did not debride any tissue near the os acromiale.  Full thickness tear was noted of the rotator cuff.  After visualizing this and debriding the anterior edge of the infraspinatus, supraspinatus  was converted to mini open repair.  All instrumentation was removed.  Portals were closed with 4-0 nylon simple sutures.  A 3 cm incision was made over the anterolateral aspect of the acromion.  Subcutaneous tissue was dissected with electrocautery to  achieve hemostasis.  Raphae between the anterior and lateral heads was  divided in line with skin incision.  A self-retaining retractor was then placed.  I identified the tear of the rotator  cuff.  A small spur on the anterolateral aspect of the acromion  was removed with 3 mm Kerrison.  I then mobilized the tear.  I extended approximately 3 cm from lateral to medial and obliquely.  Both the infraspinatus and the supraspinatus were involved.  I debrided the edges.  I decorticated the greater tuberosity  lateral to the articular surface, mobilized the tendons on the articular and bursal surface.  I felt the best configuration was a Implants from [employer] SwiveLocks one medially and 2 laterally.  Lateral to the articular surface we piloted the hole with an  awl.  We inserted two tiger tapes within that, inserted the SwiveLock excellent resistance to pullout.  We used a Scorpion suture passer and passed two through the infraspinatus, two through the supraspinatus anteriorly and anteromedially.  These were  then crossed fully covering the gap and approximating the tendon and advancing it to the greater tuberosity.  These were then secured over the lateral aspect of the greater tuberosity with 2 SwiveLocks 1 posteriorly, 1 anteriorly lateral to the bicipital  groove, which was palpated approximately 0.5 cm posterior to that.  Just over the greater tuberosity with both and we advanced the tendon without undue tension with the arm in neutral position.  We inserted the SwiveLocks.  Excellent resistance to  pullout.  We used the rescue suture to secure a posterior portion of the infraspinatus as well.  Following this, it was full coverage.  The remainder of the joint was unremarkable.  This was copiously irrigated with Prontosan joint and also subcutaneous  tissue.  Any bleeding was cauterized.  We then repaired the raphae with 0 Vicryl in interrupted figure-of-eight sutures, subcutaneous with 2-0 and skin with subcuticular Prolene.  Sterile dressing applied, placed in a sling, extubated, and transported to  the recovery room in satisfactory condition.  The patient tolerated the procedure well.  No  complications.  ASSISTANT:  Andrez Grime, PA, was used throughout the case for patient positioning, tracking the upper extremity, monitoring inflow and outflow of the arthroscopic fluid and closure.   PUS D: 01/19/2023 10:40:00 am T: 01/19/2023 11:01:00 am  JOB: [id number]/ [id number]

## 2023-01-19 NOTE — Transfer of Care (Signed)
Immediate Anesthesia Transfer of Care Note  Patient: [name]  Procedure(s) Performed: SHOULDER ARTHROSCOPY WITH MINI OPEN ROTATOR CUFF REPAIR AND SUBACROMIAL DECOMPRESSION (Right)  Patient Location: PACU  Anesthesia Type:General  Level of Consciousness: drowsy and patient cooperative  Airway & Oxygen Therapy: Patient Spontanous Breathing and Patient connected to face mask oxygen  Post-op Assessment: Report given to RN and Post -op Vital signs reviewed and stable  Post vital signs: Reviewed and stable  Last Vitals:  Vitals Value Taken Time  BP 136/81 01/19/23 1037  Temp    Pulse 121 01/19/23 1038  Resp 28 01/19/23 1038  SpO2 98 % 01/19/23 1038  Vitals shown include unfiled device data.  Last Pain:  Vitals:   01/19/23 0647  TempSrc: Oral         Complications: No notable events documented.

## 2023-01-19 NOTE — Anesthesia Procedure Notes (Signed)
Anesthesia Regional Block: Interscalene brachial plexus block   Pre-Anesthetic Checklist: , timeout performed,  Correct Patient, Correct Site, Correct Laterality,  Correct Procedure, Correct Position, site marked,  Risks and benefits discussed,  Surgical consent,  Pre-op evaluation,  At surgeon's request and post-op pain management  Laterality: Right  Prep: chloraprep       Needles:  Injection technique: Single-shot  Needle Type: Echogenic Stimulator Needle     Needle Length: 10cm  Needle Gauge: 21   Needle insertion depth: 6 cm   Additional Needles:   Procedures:,,,, ultrasound used (permanent image in chart),,   Motor weakness within 5 minutes.  Narrative:  Start time: 01/19/2023 8:17 AM End time: 01/19/2023 8:22 AM Injection made incrementally with aspirations every 5 mL.  Performed by: Personally  Anesthesiologist: Mal Amabile, MD  Additional Notes: Timeout performed. Patient sedated. Relevant anatomy ID'd using [place]. Incremental 2-79ml injection of LA with frequent aspiration. Patient tolerated procedure well.

## 2023-01-19 NOTE — Anesthesia Procedure Notes (Signed)
Procedure Name: Intubation Date/Time: 01/19/2023 8:41 AM  Performed by: Sindy Guadeloupe, CRNAPre-anesthesia Checklist: Patient identified, Emergency Drugs available, Suction available, Patient being monitored and Timeout performed Patient Re-evaluated:Patient Re-evaluated prior to induction Oxygen Delivery Method: Circle system utilized Preoxygenation: Pre-oxygenation with 100% oxygen Induction Type: IV induction Ventilation: Mask ventilation without difficulty Laryngoscope Size: Mac and 4 Grade View: Grade I Tube type: Oral Tube size: 7.0 mm Number of attempts: 1 Airway Equipment and Method: Stylet Placement Confirmation: ETT inserted through vocal cords under direct vision, positive ETCO2 and breath sounds checked- equal and bilateral Secured at: 21 cm Tube secured with: Tape Dental Injury: Teeth and Oropharynx as per pre-operative assessment

## 2023-01-19 NOTE — Discharge Instructions (Signed)

## 2023-01-19 NOTE — Brief Op Note (Signed)
01/19/2023  10:26 AM  PATIENT:  Yolanda Gordon  62 y.o. female  PRE-OPERATIVE DIAGNOSIS:  Right shoulder rotator cuff tear  POST-OPERATIVE DIAGNOSIS:  Right shoulder rotator cuff tear  PROCEDURE:  Procedure(s): SHOULDER ARTHROSCOPY WITH MINI OPEN ROTATOR CUFF REPAIR AND SUBACROMIAL DECOMPRESSION (Right)  DIAGNOSES: Right shoulder, acute traumatic rotator cuff tear.  POST-OPERATIVE DIAGNOSIS: same Labral tear  PROCEDURE: Open repair acute rotator cuff tear - 78295 Arthroscopic extensive debridement - 29823 Subdeltoid Bursa, Supraspinatus Tendon, Anterior Labrum, and Superior Labrum Arthroscopic subacromial decompression - 62130   OPERATIVE FINDING: Exam under anesthesia: Normal Articular space: Normal Chondral surfaces:  mild djd Biceps: Normal Subscapularis: Intact none Supraspinatus: Complete tear 2 by 2 cm Infraspinatus: Complete tear 1 by 1 cm   SURGEON:  Surgeons and Role:    Jene Every, MD - Primary  PHYSICIAN ASSISTANT:   ASSISTANTS: Bissell   ANESTHESIA:   general  EBL:  10 mL   BLOOD ADMINISTERED: none  DRAINS: none   LOCAL MEDICATIONS USED:  MARCAINE     SPECIMEN:  No Specimen  DISPOSITION OF SPECIMEN:  N/A  COUNTS:  YES  TOURNIQUET:  * No tourniquets in log *  DICTATION: .Other Dictation: Dictation Number   [id number]  PLAN OF CARE: Discharge to home after PACU  PATIENT DISPOSITION:  PACU - hemodynamically stable.   Delay start of Pharmacological VTE agent (>24hrs) due to surgical blood loss or risk of bleeding: no

## 2023-01-20 ENCOUNTER — Encounter (HOSPITAL_COMMUNITY): Payer: Self-pay | Admitting: Specialist

## 2023-03-08 ENCOUNTER — Encounter: Payer: Self-pay | Admitting: Nurse Practitioner

## 2024-01-31 ENCOUNTER — Ambulatory Visit: Payer: Self-pay | Admitting: Nurse Practitioner

## 2024-02-03 ENCOUNTER — Ambulatory Visit: Payer: Self-pay | Admitting: Family

## 2024-02-03 ENCOUNTER — Encounter: Payer: Self-pay | Admitting: Nurse Practitioner

## 2024-02-07 ENCOUNTER — Ambulatory Visit (INDEPENDENT_AMBULATORY_CARE_PROVIDER_SITE_OTHER): Payer: Self-pay | Admitting: Nurse Practitioner

## 2024-02-07 ENCOUNTER — Telehealth: Payer: Self-pay | Admitting: Nurse Practitioner

## 2024-02-07 ENCOUNTER — Encounter: Payer: Self-pay | Admitting: Nurse Practitioner

## 2024-02-07 VITALS — BP 110/70 | HR 79 | Temp 98.9°F | Ht 62.0 in | Wt 147.0 lb

## 2024-02-07 DIAGNOSIS — R5383 Other fatigue: Secondary | ICD-10-CM

## 2024-02-07 DIAGNOSIS — F5101 Primary insomnia: Secondary | ICD-10-CM

## 2024-02-07 MED ORDER — TRAZODONE HCL 50 MG PO TABS
25.0000 mg | ORAL_TABLET | Freq: Every evening | ORAL | 3 refills | Status: DC | PRN
Start: 2024-02-07 — End: 2024-04-26

## 2024-02-07 NOTE — Telephone Encounter (Unsigned)
 Copied from CRM #[id number]. Topic: Appointments - Scheduling Inquiry for Clinic >> Feb 07, 2024  4:12 PM Yolanda Gordon wrote: Reason for CRM: need to schedule bone scan- [phone]

## 2024-02-07 NOTE — Patient Instructions (Signed)
 Insomnia Insomnia is a sleep disorder that makes it difficult to fall asleep or stay asleep. Insomnia can cause fatigue, low energy, difficulty concentrating, mood swings, and poor performance at work or school. There are three different ways to classify insomnia: Difficulty falling asleep. Difficulty staying asleep. Waking up too early in the morning. Any type of insomnia can be long-term (chronic) or short-term (acute). Both are common. Short-term insomnia usually lasts for 3 months or less. Chronic insomnia occurs at least three times a week for longer than 3 months. What are the causes? Insomnia may be caused by another condition, situation, or substance, such as: Having certain mental health conditions, such as anxiety and depression. Using caffeine, alcohol , tobacco, or drugs. Having gastrointestinal conditions, such as gastroesophageal reflux disease (GERD). Having certain medical conditions. These include: Asthma. Alzheimer's disease. Stroke. Chronic pain. An overactive thyroid  gland (hyperthyroidism). Other sleep disorders, such as restless legs syndrome and sleep apnea. Menopause. Sometimes, the cause of insomnia may not be known. What increases the risk? Risk factors for insomnia include: Gender. Females are affected more often than males. Age. Insomnia is more common as people get older. Stress and certain medical and mental health conditions. Lack of exercise. Having an irregular work schedule. This may include working night shifts and traveling between different time zones. What are the signs or symptoms? If you have insomnia, the main symptom is having trouble falling asleep or having trouble staying asleep. This may lead to other symptoms, such as: Feeling tired or having low energy. Feeling nervous about going to sleep. Not feeling rested in the morning. Having trouble concentrating. Feeling irritable, anxious, or depressed. How is this diagnosed? This condition  may be diagnosed based on: Your symptoms and medical history. Your health care provider may ask about: Your sleep habits. Any medical conditions you have. Your mental health. A physical exam. How is this treated? Treatment for insomnia depends on the cause. Treatment may focus on treating an underlying condition that is causing the insomnia. Treatment may also include: Medicines to help you sleep. Counseling or therapy. Lifestyle adjustments to help you sleep better. Follow these instructions at home: Eating and drinking  Limit or avoid alcohol , caffeinated beverages, and products that contain nicotine and tobacco, especially close to bedtime. These can disrupt your sleep. Do not eat a large meal or eat spicy foods right before bedtime. This can lead to digestive discomfort that can make it hard for you to sleep. Sleep habits  Keep a sleep diary to help you and your health care provider figure out what could be causing your insomnia. Write down: When you sleep. When you wake up during the night. How well you sleep and how rested you feel the next day. Any side effects of medicines you are taking. What you eat and drink. Make your bedroom a dark, comfortable place where it is easy to fall asleep. Put up shades or blackout curtains to block light from outside. Use a white noise machine to block noise. Keep the temperature cool. Limit screen use before bedtime. This includes: Not watching TV. Not using your smartphone, tablet, or computer. Stick to a routine that includes going to bed and waking up at the same times every day and night. This can help you fall asleep faster. Consider making a quiet activity, such as reading, part of your nighttime routine. Try to avoid taking naps during the day so that you sleep better at night. Get out of bed if you are still awake after  15 minutes of trying to sleep. Keep the lights down, but try reading or doing a quiet activity. When you feel  sleepy, go back to bed. General instructions Take over-the-counter and prescription medicines only as told by your health care provider. Exercise regularly as told by your health care provider. However, avoid exercising in the hours right before bedtime. Use relaxation techniques to manage stress. Ask your health care provider to suggest some techniques that may work well for you. These may include: Breathing exercises. Routines to release muscle tension. Visualizing peaceful scenes. Make sure that you drive carefully. Do not drive if you feel very sleepy. Keep all follow-up visits. This is important. Contact a health care provider if: You are tired throughout the day. You have trouble in your daily routine due to sleepiness. You continue to have sleep problems, or your sleep problems get worse. Get help right away if: You have thoughts about hurting yourself or someone else. Get help right away if you feel like you may hurt yourself or others, or have thoughts about taking your own life. Go to your nearest emergency room or: Call 911. Call the National Suicide Prevention Lifeline at 2232757840 or 988. This is open 24 hours a day. Text the Crisis Text Line at 657-529-4371. Summary Insomnia is a sleep disorder that makes it difficult to fall asleep or stay asleep. Insomnia can be long-term (chronic) or short-term (acute). Treatment for insomnia depends on the cause. Treatment may focus on treating an underlying condition that is causing the insomnia. Keep a sleep diary to help you and your health care provider figure out what could be causing your insomnia. This information is not intended to replace advice given to you by your health care provider. Make sure you discuss any questions you have with your health care provider. Document Revised: 05/11/2021 Document Reviewed: 05/11/2021 Elsevier Patient Education  2024 [employer].

## 2024-02-07 NOTE — Progress Notes (Signed)
   Subjective:    Patient ID: Yolanda Gordon, female    DOB: 12-23-1960, 63 y.o.   MRN: [id number]   Chief Complaint: Fatigue (Low energy)   HPI  Patient comes in today c/o low energy. Seems to have in th elast 2 weeks. She denies all other symptoms. Only positive issue is trouble falling asleep. Sometimes she is up until 2-3 AM. Once she is asleep she sleeps good. Not had a peroid in many years.   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 Negative for chills and fever.  HENT:  Negative for congestion, postnasal drip, rhinorrhea and sinus pain.   Respiratory:  Negative for cough and shortness of breath.   Cardiovascular:  Negative for chest pain and leg swelling.  Neurological:  Negative for dizziness.  Psychiatric/Behavioral:  Positive for sleep disturbance. Negative for behavioral problems. The patient is not nervous/anxious.        Objective:   Physical Exam Constitutional:      Appearance: Normal appearance.  HENT:     Right Ear: Tympanic membrane normal.     Left Ear: Tympanic membrane normal.     Nose: Nose normal.     Mouth/Throat:     Mouth: Mucous membranes are moist.  Eyes:     Extraocular Movements: Extraocular movements intact.     Pupils: Pupils are equal, round, and reactive to light.  Cardiovascular:     Rate and Rhythm: Normal rate and regular rhythm.  Pulmonary:     Effort: Pulmonary effort is normal.     Breath sounds: Normal breath sounds.  Skin:    General: Skin is warm.  Neurological:     General: No focal deficit present.     Mental Status: She is alert and oriented to person, place, and time.     Comments: Right facial drooping- has had for many years- no change  Psychiatric:        Mood and Affect: Mood normal.        Behavior: Behavior normal.     BP 110/70   Pulse 79   Temp 98.9 F (37.2 C)   Ht 5' 2 (1.575 m)   Wt 147 lb  (66.7 kg)   SpO2 98%   BMI 26.89 kg/m        Assessment & Plan:   Yolanda Gordon in today with chief complaint of Fatigue (Low energy)   1. Other fatigue (Primary) Labs pending - CBC with Differential/Platelet - CMP14+EGFR - Thyroid  Panel With TSH  2. Primary insomnia Bedtime routine - traZODone  (DESYREL ) 50 MG tablet; Take 0.5-1 tablets (25-50 mg total) by mouth at bedtime as needed for sleep.  Dispense: 30 tablet; Refill: 3    The above assessment and management plan was discussed with the patient. The patient verbalized understanding of and has agreed to the management plan. Patient is aware to call the clinic if symptoms persist or worsen. Patient is aware when to return to the clinic for a follow-up visit. Patient educated on when it is appropriate to go to the emergency department.   Mary-Margaret Gladis, FNP

## 2024-02-08 LAB — CBC WITH DIFFERENTIAL/PLATELET
Basophils Absolute: 0.1 x10E3/uL (ref 0.0–0.2)
Basos: 1 %
EOS (ABSOLUTE): 0.6 x10E3/uL — ABNORMAL HIGH (ref 0.0–0.4)
Eos: 9 %
Hematocrit: 39.5 % (ref 34.0–46.6)
Hemoglobin: 12.8 g/dL (ref 11.1–15.9)
Immature Grans (Abs): 0 x10E3/uL (ref 0.0–0.1)
Immature Granulocytes: 0 %
Lymphocytes Absolute: 1.8 x10E3/uL (ref 0.7–3.1)
Lymphs: 27 %
MCH: 29.7 pg (ref 26.6–33.0)
MCHC: 32.4 g/dL (ref 31.5–35.7)
MCV: 92 fL (ref 79–97)
Monocytes Absolute: 0.5 x10E3/uL (ref 0.1–0.9)
Monocytes: 8 %
Neutrophils Absolute: 3.5 x10E3/uL (ref 1.4–7.0)
Neutrophils: 54 %
Platelets: 397 x10E3/uL (ref 150–450)
RBC: 4.31 x10E6/uL (ref 3.77–5.28)
RDW: 12.4 % (ref 11.7–15.4)
WBC: 6.6 x10E3/uL (ref 3.4–10.8)

## 2024-02-08 LAB — THYROID PANEL WITH TSH
Free Thyroxine Index: 1.6 (ref 1.2–4.9)
T3 Uptake Ratio: 22 — AB (ref 24–39)
T4, Total: 7.2 ug/dL (ref 4.5–12.0)
TSH: 2.59 u[IU]/mL (ref 0.450–4.500)

## 2024-02-08 LAB — CMP14+EGFR
ALT: 15 IU/L (ref 0–32)
AST: 19 IU/L (ref 0–40)
Albumin: 4.3 g/dL (ref 3.9–4.9)
Alkaline Phosphatase: 99 IU/L (ref 44–121)
BUN/Creatinine Ratio: 13 (ref 12–28)
BUN: 11 mg/dL (ref 8–27)
Bilirubin Total: 0.4 mg/dL (ref 0.0–1.2)
CO2: 24 mmol/L (ref 20–29)
Calcium: 9.6 mg/dL (ref 8.7–10.3)
Chloride: 102 mmol/L (ref 96–106)
Creatinine, Ser: 0.82 mg/dL (ref 0.57–1.00)
Globulin, Total: 2.4 g/dL (ref 1.5–4.5)
Glucose: 101 mg/dL — AB (ref 70–99)
Potassium: 5.3 mmol/L — AB (ref 3.5–5.2)
Sodium: 138 mmol/L (ref 134–144)
Total Protein: 6.7 g/dL (ref 6.0–8.5)
eGFR: 81 mL/min/1.73 (ref >59)

## 2024-02-08 NOTE — Telephone Encounter (Signed)
 Called patient and made appointment for 02/28/2024

## 2024-02-09 ENCOUNTER — Ambulatory Visit: Payer: Self-pay | Admitting: Nurse Practitioner

## 2024-02-20 ENCOUNTER — Encounter

## 2024-02-28 ENCOUNTER — Other Ambulatory Visit

## 2024-03-07 ENCOUNTER — Other Ambulatory Visit

## 2024-03-26 ENCOUNTER — Other Ambulatory Visit: Payer: Self-pay | Admitting: Nurse Practitioner

## 2024-03-26 ENCOUNTER — Encounter

## 2024-03-26 DIAGNOSIS — Z1231 Encounter for screening mammogram for malignant neoplasm of breast: Secondary | ICD-10-CM

## 2024-04-19 ENCOUNTER — Telehealth: Payer: Self-pay | Admitting: Family Medicine

## 2024-04-19 ENCOUNTER — Telehealth: Payer: Self-pay

## 2024-04-19 NOTE — Telephone Encounter (Signed)
 I called and spoke with patient and got her scheduled for a 3 mth follow up with PCP and to check labs.

## 2024-04-19 NOTE — Telephone Encounter (Signed)
 Patient stated she is calling to schedule an appointment for lab work.

## 2024-04-19 NOTE — Telephone Encounter (Unsigned)
 Copied from CRM #[id number]. Topic: Clinical - Request for Lab/Test Order >> Apr 19, 2024  3:15 PM Joesph B wrote: Reason for CRM: patient would like to come in for blood work if orders can be put in. PH:[phone]

## 2024-04-26 ENCOUNTER — Encounter: Payer: Self-pay | Admitting: Nurse Practitioner

## 2024-04-26 ENCOUNTER — Ambulatory Visit (INDEPENDENT_AMBULATORY_CARE_PROVIDER_SITE_OTHER): Admitting: Nurse Practitioner

## 2024-04-26 VITALS — BP 107/70 | HR 65 | Temp 97.7°F | Ht 62.0 in | Wt 149.0 lb

## 2024-04-26 DIAGNOSIS — R5383 Other fatigue: Secondary | ICD-10-CM

## 2024-04-26 DIAGNOSIS — F5101 Primary insomnia: Secondary | ICD-10-CM

## 2024-04-26 MED ORDER — TRAZODONE HCL 50 MG PO TABS
25.0000 mg | ORAL_TABLET | Freq: Every evening | ORAL | 1 refills | Status: AC | PRN
Start: 2024-04-26 — End: ?

## 2024-04-26 NOTE — Progress Notes (Signed)
 Subjective:    Patient ID: Yolanda Gordon, female    DOB: 02-04-1961, 63 y.o.   MRN: [id number]   Chief Complaint: fatigue  HPI  Patient was seen on 02/07/24 c/o fatigue. No other symptoms. Labs were done and cbc was normal, kidney and liver normal and thyroid  was normal. She had been having trouble sleeping so we starte dher on trazadone to see if would help her overall. She is sleeping better but still feels tired.  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 Negative for chills and fever.  Respiratory:  Negative for cough and shortness of breath.   Cardiovascular:  Negative for chest pain, palpitations and leg swelling.       Objective:   Physical Exam Vitals and nursing note reviewed.  Constitutional:      General: She is not in acute distress.    Appearance: Normal appearance. She is well-developed.  HENT:     Head: Normocephalic.     Right Ear: Tympanic membrane normal.     Left Ear: Tympanic membrane normal.     Nose: Nose normal.     Mouth/Throat:     Mouth: Mucous membranes are moist.  Eyes:     Pupils: Pupils are equal, round, and reactive to light.  Neck:     Vascular: No carotid bruit or JVD.  Cardiovascular:     Rate and Rhythm: Normal rate and regular rhythm.     Heart sounds: Normal heart sounds.  Pulmonary:     Effort: Pulmonary effort is normal. No respiratory distress.     Breath sounds: Normal breath sounds. No wheezing or rales.  Chest:     Chest wall: No tenderness.  Abdominal:     General: Bowel sounds are normal. There is no distension or abdominal bruit.     Palpations: Abdomen is soft. There is no hepatomegaly, splenomegaly, mass or pulsatile mass.     Tenderness: There is no abdominal tenderness.  Musculoskeletal:        General: Normal range of motion.     Cervical back: Normal range of motion and neck supple.  Lymphadenopathy:      Cervical: No cervical adenopathy.  Skin:    General: Skin is warm and dry.  Neurological:     Mental Status: She is alert and oriented to person, place, and time.     Deep Tendon Reflexes: Reflexes are normal and symmetric.  Psychiatric:        Behavior: Behavior normal.        Thought Content: Thought content normal.        Judgment: Judgment normal.    BP 107/70   Pulse 65   Temp 97.7 F (36.5 C) (Temporal)   Ht 5' 2 (1.575 m)   Wt 149 lb (67.6 kg)   SpO2 100%   BMI 27.25 kg/m         Assessment & Plan:   Yolanda Gordon in today with chief complaint of Medical Management of Chronic Issues   1. Other fatigue (Primary) Labs pending - CBC with Differential/Platelet - CMP14+EGFR - Thyroid  Panel With TSH - Vitamin B12 - VITAMIN D 25 Hydroxy (Vit-D Deficiency, Fractures)  2. Primary insomnia Bedtime routine  - traZODone  (DESYREL ) 50 MG tablet; Take 0.5-1 tablets (25-50 mg total) by mouth at bedtime as needed for sleep.  Dispense: 90 tablet; Refill:  1    The above assessment and management plan was discussed with the patient. The patient verbalized understanding of and has agreed to the management plan. Patient is aware to call the clinic if symptoms persist or worsen. Patient is aware when to return to the clinic for a follow-up visit. Patient educated on when it is appropriate to go to the emergency department.   Mary-Margaret Gladis, FNP

## 2024-04-26 NOTE — Patient Instructions (Signed)
 Insomnia Insomnia is a sleep disorder that makes it difficult to fall asleep or stay asleep. Insomnia can cause fatigue, low energy, difficulty concentrating, mood swings, and poor performance at work or school. There are three different ways to classify insomnia: Difficulty falling asleep. Difficulty staying asleep. Waking up too early in the morning. Any type of insomnia can be long-term (chronic) or short-term (acute). Both are common. Short-term insomnia usually lasts for 3 months or less. Chronic insomnia occurs at least three times a week for longer than 3 months. What are the causes? Insomnia may be caused by another condition, situation, or substance, such as: Having certain mental health conditions, such as anxiety and depression. Using caffeine, alcohol , tobacco, or drugs. Having gastrointestinal conditions, such as gastroesophageal reflux disease (GERD). Having certain medical conditions. These include: Asthma. Alzheimer's disease. Stroke. Chronic pain. An overactive thyroid  gland (hyperthyroidism). Other sleep disorders, such as restless legs syndrome and sleep apnea. Menopause. Sometimes, the cause of insomnia may not be known. What increases the risk? Risk factors for insomnia include: Gender. Females are affected more often than males. Age. Insomnia is more common as people get older. Stress and certain medical and mental health conditions. Lack of exercise. Having an irregular work schedule. This may include working night shifts and traveling between different time zones. What are the signs or symptoms? If you have insomnia, the main symptom is having trouble falling asleep or having trouble staying asleep. This may lead to other symptoms, such as: Feeling tired or having low energy. Feeling nervous about going to sleep. Not feeling rested in the morning. Having trouble concentrating. Feeling irritable, anxious, or depressed. How is this diagnosed? This condition  may be diagnosed based on: Your symptoms and medical history. Your health care provider may ask about: Your sleep habits. Any medical conditions you have. Your mental health. A physical exam. How is this treated? Treatment for insomnia depends on the cause. Treatment may focus on treating an underlying condition that is causing the insomnia. Treatment may also include: Medicines to help you sleep. Counseling or therapy. Lifestyle adjustments to help you sleep better. Follow these instructions at home: Eating and drinking  Limit or avoid alcohol , caffeinated beverages, and products that contain nicotine and tobacco, especially close to bedtime. These can disrupt your sleep. Do not eat a large meal or eat spicy foods right before bedtime. This can lead to digestive discomfort that can make it hard for you to sleep. Sleep habits  Keep a sleep diary to help you and your health care provider figure out what could be causing your insomnia. Write down: When you sleep. When you wake up during the night. How well you sleep and how rested you feel the next day. Any side effects of medicines you are taking. What you eat and drink. Make your bedroom a dark, comfortable place where it is easy to fall asleep. Put up shades or blackout curtains to block light from outside. Use a white noise machine to block noise. Keep the temperature cool. Limit screen use before bedtime. This includes: Not watching TV. Not using your smartphone, tablet, or computer. Stick to a routine that includes going to bed and waking up at the same times every day and night. This can help you fall asleep faster. Consider making a quiet activity, such as reading, part of your nighttime routine. Try to avoid taking naps during the day so that you sleep better at night. Get out of bed if you are still awake after  15 minutes of trying to sleep. Keep the lights down, but try reading or doing a quiet activity. When you feel  sleepy, go back to bed. General instructions Take over-the-counter and prescription medicines only as told by your health care provider. Exercise regularly as told by your health care provider. However, avoid exercising in the hours right before bedtime. Use relaxation techniques to manage stress. Ask your health care provider to suggest some techniques that may work well for you. These may include: Breathing exercises. Routines to release muscle tension. Visualizing peaceful scenes. Make sure that you drive carefully. Do not drive if you feel very sleepy. Keep all follow-up visits. This is important. Contact a health care provider if: You are tired throughout the day. You have trouble in your daily routine due to sleepiness. You continue to have sleep problems, or your sleep problems get worse. Get help right away if: You have thoughts about hurting yourself or someone else. Get help right away if you feel like you may hurt yourself or others, or have thoughts about taking your own life. Go to your nearest emergency room or: Call 911. Call the National Suicide Prevention Lifeline at 2232757840 or 988. This is open 24 hours a day. Text the Crisis Text Line at 657-529-4371. Summary Insomnia is a sleep disorder that makes it difficult to fall asleep or stay asleep. Insomnia can be long-term (chronic) or short-term (acute). Treatment for insomnia depends on the cause. Treatment may focus on treating an underlying condition that is causing the insomnia. Keep a sleep diary to help you and your health care provider figure out what could be causing your insomnia. This information is not intended to replace advice given to you by your health care provider. Make sure you discuss any questions you have with your health care provider. Document Revised: 05/11/2021 Document Reviewed: 05/11/2021 Elsevier Patient Education  2024 [employer].

## 2024-04-27 LAB — CBC WITH DIFFERENTIAL/PLATELET
Basophils Absolute: 0.1 x10E3/uL (ref 0.0–0.2)
Basos: 1 %
EOS (ABSOLUTE): 0.8 x10E3/uL — ABNORMAL HIGH (ref 0.0–0.4)
Eos: 12 %
Hematocrit: 36.5 % (ref 34.0–46.6)
Hemoglobin: 11.9 g/dL (ref 11.1–15.9)
Immature Grans (Abs): 0 x10E3/uL (ref 0.0–0.1)
Immature Granulocytes: 0 %
Lymphocytes Absolute: 1.7 x10E3/uL (ref 0.7–3.1)
Lymphs: 28 %
MCH: 30 pg (ref 26.6–33.0)
MCHC: 32.6 g/dL (ref 31.5–35.7)
MCV: 92 fL (ref 79–97)
Monocytes Absolute: 0.6 x10E3/uL (ref 0.1–0.9)
Monocytes: 9 %
Neutrophils Absolute: 3.1 x10E3/uL (ref 1.4–7.0)
Neutrophils: 50 %
Platelets: 367 x10E3/uL (ref 150–450)
RBC: 3.97 x10E6/uL (ref 3.77–5.28)
RDW: 12.9 % (ref 11.7–15.4)
WBC: 6.2 x10E3/uL (ref 3.4–10.8)

## 2024-04-27 LAB — CMP14+EGFR
ALT: 13 IU/L (ref 0–32)
AST: 17 IU/L (ref 0–40)
Albumin: 4.2 g/dL (ref 3.9–4.9)
Alkaline Phosphatase: 82 IU/L (ref 49–135)
BUN/Creatinine Ratio: 16 (ref 12–28)
BUN: 13 mg/dL (ref 8–27)
Bilirubin Total: 0.3 mg/dL (ref 0.0–1.2)
CO2: 24 mmol/L (ref 20–29)
Calcium: 9.7 mg/dL (ref 8.7–10.3)
Chloride: 101 mmol/L (ref 96–106)
Creatinine, Ser: 0.82 mg/dL (ref 0.57–1.00)
Globulin, Total: 2.6 g/dL (ref 1.5–4.5)
Glucose: 94 mg/dL (ref 70–99)
Potassium: 5.4 mmol/L — ABNORMAL HIGH (ref 3.5–5.2)
Sodium: 138 mmol/L (ref 134–144)
Total Protein: 6.8 g/dL (ref 6.0–8.5)
eGFR: 81 mL/min/1.73 (ref >59)

## 2024-04-27 LAB — THYROID PANEL WITH TSH
Free Thyroxine Index: 1.5 (ref 1.2–4.9)
T3 Uptake Ratio: 23 % — AB (ref 24–39)
T4, Total: 6.6 ug/dL (ref 4.5–12.0)
TSH: 4.04 u[IU]/mL (ref 0.450–4.500)

## 2024-04-27 LAB — VITAMIN D 25 HYDROXY (VIT D DEFICIENCY, FRACTURES): Vit D, 25-Hydroxy: 4.6 ng/mL — AB (ref 30.0–100.0)

## 2024-04-27 LAB — VITAMIN B12: Vitamin B-12: 232 pg/mL (ref 232–1245)

## 2024-04-30 ENCOUNTER — Ambulatory Visit: Payer: Self-pay | Admitting: Nurse Practitioner

## 2024-04-30 ENCOUNTER — Telehealth: Payer: Self-pay | Admitting: Family Medicine

## 2024-04-30 DIAGNOSIS — E559 Vitamin D deficiency, unspecified: Secondary | ICD-10-CM

## 2024-04-30 MED ORDER — VITAMIN D (ERGOCALCIFEROL) 1.25 MG (50000 UNIT) PO CAPS
50000.0000 [IU] | ORAL_CAPSULE | ORAL | 5 refills | Status: AC
Start: 2024-04-30 — End: ?

## 2024-04-30 NOTE — Telephone Encounter (Signed)
 Noted

## 2024-04-30 NOTE — Telephone Encounter (Signed)
 Copied from CRM #[id number]. Topic: Clinical - Lab/Test Results >> Apr 30, 2024  9:34 AM Diannia H wrote: Reason for CRM: Patient called went over her labs with her, she was okay with understanding, made her a lab appt to recheck her Potassium

## 2024-05-01 ENCOUNTER — Telehealth: Payer: Self-pay | Admitting: *Deleted

## 2024-05-01 ENCOUNTER — Other Ambulatory Visit: Payer: Self-pay | Admitting: Family Medicine

## 2024-05-01 ENCOUNTER — Telehealth: Payer: Self-pay | Admitting: Nurse Practitioner

## 2024-05-01 DIAGNOSIS — E875 Hyperkalemia: Secondary | ICD-10-CM

## 2024-05-01 NOTE — Telephone Encounter (Signed)
 Pt called on call service last night RE: prescription sent in for Vit D. She is not able to take it. She is needing to have this switched to either pill or liquid form. Please advise

## 2024-05-01 NOTE — Telephone Encounter (Signed)
 Patient has appt 11-19 to repeat potassium and orders needs to be added for MMM

## 2024-05-01 NOTE — Telephone Encounter (Signed)
 I called and spoke with patient and made her aware of PCP recommendations. She voiced understanding.

## 2024-05-01 NOTE — Telephone Encounter (Signed)
 No liquid vitamin d in prescription form. Will have to but OTC

## 2024-05-01 NOTE — Telephone Encounter (Signed)
 Order placed

## 2024-05-02 ENCOUNTER — Other Ambulatory Visit

## 2024-05-02 DIAGNOSIS — E875 Hyperkalemia: Secondary | ICD-10-CM

## 2024-05-02 LAB — BMP8+EGFR
BUN/Creatinine Ratio: 14 (ref 12–28)
BUN: 11 mg/dL (ref 8–27)
CO2: 24 mmol/L (ref 20–29)
Calcium: 9.2 mg/dL (ref 8.7–10.3)
Chloride: 103 mmol/L (ref 96–106)
Creatinine, Ser: 0.8 mg/dL (ref 0.57–1.00)
Glucose: 47 mg/dL — ABNORMAL LOW (ref 70–99)
Potassium: 4 mmol/L (ref 3.5–5.2)
Sodium: 139 mmol/L (ref 134–144)
eGFR: 83 mL/min/1.73 (ref >59)

## 2024-05-03 ENCOUNTER — Telehealth: Payer: Self-pay

## 2024-05-03 ENCOUNTER — Other Ambulatory Visit

## 2024-05-03 ENCOUNTER — Ambulatory Visit: Payer: Self-pay | Admitting: Nurse Practitioner

## 2024-05-03 NOTE — Telephone Encounter (Signed)
 Copied from CRM #[id number]. Topic: Clinical - Lab/Test Results >> May 03, 2024  9:57 AM Delon DASEN wrote: Reason for CRM: called about lab results- gave message, no questions

## 2024-05-03 NOTE — Telephone Encounter (Signed)
 Will document these notes in lab result notes.

## 2024-08-08 ENCOUNTER — Encounter
